# Patient Record
Sex: Male | Born: 1955 | Race: White | Hispanic: No | Marital: Married | Smoking: Never smoker
Health system: Southern US, Community
[De-identification: ages and names within clinical notes are randomized; demographics above are authoritative.]

## PROBLEM LIST (undated history)

## (undated) DIAGNOSIS — K760 Fatty (change of) liver, not elsewhere classified: Secondary | ICD-10-CM

## (undated) DIAGNOSIS — J45909 Unspecified asthma, uncomplicated: Secondary | ICD-10-CM

## (undated) DIAGNOSIS — K5790 Diverticulosis of intestine, part unspecified, without perforation or abscess without bleeding: Secondary | ICD-10-CM

## (undated) DIAGNOSIS — E785 Hyperlipidemia, unspecified: Secondary | ICD-10-CM

## (undated) DIAGNOSIS — N201 Calculus of ureter: Secondary | ICD-10-CM

## (undated) DIAGNOSIS — Z973 Presence of spectacles and contact lenses: Secondary | ICD-10-CM

## (undated) DIAGNOSIS — N200 Calculus of kidney: Secondary | ICD-10-CM

## (undated) DIAGNOSIS — R7303 Prediabetes: Secondary | ICD-10-CM

## (undated) DIAGNOSIS — D3501 Benign neoplasm of right adrenal gland: Secondary | ICD-10-CM

## (undated) DIAGNOSIS — H04123 Dry eye syndrome of bilateral lacrimal glands: Secondary | ICD-10-CM

## (undated) DIAGNOSIS — U071 COVID-19: Secondary | ICD-10-CM

## (undated) HISTORY — PX: CARDIOVASCULAR STRESS TEST: SHX262

## (undated) HISTORY — PX: TONSILLECTOMY: SUR1361

## (undated) HISTORY — DX: Fatty (change of) liver, not elsewhere classified: K76.0

## (undated) HISTORY — DX: Unspecified asthma, uncomplicated: J45.909

## (undated) HISTORY — DX: Hyperlipidemia, unspecified: E78.5

## (undated) HISTORY — PX: OTHER SURGICAL HISTORY: SHX169

## (undated) HISTORY — PX: TRANSTHORACIC ECHOCARDIOGRAM: SHX275

## (undated) HISTORY — DX: COVID-19: U07.1

---

## 1995-03-17 HISTORY — PX: INGUINAL HERNIA REPAIR: SUR1180

## 2001-11-16 ENCOUNTER — Encounter: Admission: RE | Admit: 2001-11-16 | Discharge: 2001-11-16 | Payer: Self-pay | Admitting: General Surgery

## 2001-11-16 ENCOUNTER — Encounter: Payer: Self-pay | Admitting: General Surgery

## 2001-11-18 ENCOUNTER — Ambulatory Visit (HOSPITAL_BASED_OUTPATIENT_CLINIC_OR_DEPARTMENT_OTHER): Admission: RE | Admit: 2001-11-18 | Discharge: 2001-11-18 | Payer: Self-pay | Admitting: General Surgery

## 2001-11-18 ENCOUNTER — Encounter (INDEPENDENT_AMBULATORY_CARE_PROVIDER_SITE_OTHER): Payer: Self-pay | Admitting: Specialist

## 2003-06-06 ENCOUNTER — Emergency Department (HOSPITAL_COMMUNITY): Admission: EM | Admit: 2003-06-06 | Discharge: 2003-06-06 | Payer: Self-pay | Admitting: Emergency Medicine

## 2003-10-30 ENCOUNTER — Encounter: Admission: RE | Admit: 2003-10-30 | Discharge: 2003-10-30 | Payer: Self-pay | Admitting: Family Medicine

## 2003-11-14 ENCOUNTER — Encounter: Admission: RE | Admit: 2003-11-14 | Discharge: 2003-11-14 | Payer: Self-pay | Admitting: Family Medicine

## 2006-11-05 ENCOUNTER — Ambulatory Visit (HOSPITAL_COMMUNITY): Admission: RE | Admit: 2006-11-05 | Discharge: 2006-11-05 | Payer: Self-pay | Admitting: Gastroenterology

## 2006-11-05 ENCOUNTER — Encounter (INDEPENDENT_AMBULATORY_CARE_PROVIDER_SITE_OTHER): Payer: Self-pay | Admitting: Diagnostic Radiology

## 2007-03-17 HISTORY — PX: COLONOSCOPY: SHX174

## 2010-08-01 NOTE — Op Note (Addendum)
   NAMESKYELER, SMOLA                        ACCOUNT NO.:  [id number]   MEDICAL RECORD NO.:  [id number]                   PATIENT TYPE:  AMB   LOCATION:  DSC                                  FACILITY:  [facility]   PHYSICIAN:  Sharlet Salina T. Hoxworth, M.D.          DATE OF BIRTH:  [birth year]   DATE OF PROCEDURE:  DATE OF DISCHARGE:                                 OPERATIVE REPORT   PREOPERATIVE DIAGNOSIS:  Subcutaneous mass, posterior scalp.   POSTOPERATIVE DIAGNOSIS:  Subcutaneous mass, posterior scalp.   PROCEDURE:  Excision subcutaneous mass, posterior scalp.   SURGEON:  Dr. Johna Sheriff.   ANESTHESIA:  Local IV sedation.   BRIEF HISTORY AND PHYSICAL:  Mr. Stiff is a 55 year old white male who has  had a lump palpable in his right occipital scalp for several years.  Recently, it has become enlarged and become significantly uncomfortable with  any pressure in the area.  Examination reveals a 4 cm soft, fleshy mass in  the right occiput subcutaneous tissue consistent with a lipoma.  Due to  enlargement and pain, we have elected to proceed with excision.  The nature  of the procedure, its indications, and risks of bleeding, infection, and  injury to the greater occipital nerve were discussed and understood.  He is  now brought to the operating room for this procedure.   DESCRIPTION OF PROCEDURE:  The patient was brought to the operating room,  placed in supine position on the operating table, and IV sedation was  administered.  He was carefully positioned in the left lateral decubitus  position, and the posterior scalp sterilely prepped and draped.  Local  anesthesia was used to infiltrate the scalp overlying the lesion in the  surrounding soft tissue.  A transverse incision was made directly over the  mass and dissection was carried down through the subcutaneous tissue onto a  well-encapsulated, typical-appearing lipoma.  Although well encapsulated, it  was fairly adherent to the  surrounding subcutaneous tissue which was sharply  dissected free.  It was also quite adherent to the level of the fascia and  was sharply dissected off of this and completely excised.  I was looking for  the greater occipital nerve during the dissection, but it was not  encountered.  Hemostasis was obtained with cautery.  The wound was then  closed with running mattress sutures of 4-0 nylon.  The sponge and needle  counts were correct.  A ____ QA MARKER: 142 ____ dress  the patient was taken to recovery in satisfactory condition.                                               Lorne Skeens. Hoxworth, M.D.    Tory Emerald  D:  11/18/2001  T:  11/20/2001  Job:  [id number]

## 2010-12-26 LAB — CBC
HCT: 45.8
MCHC: 34.9
MCV: 92.7
RBC: 4.94
WBC: 5.2

## 2011-12-29 ENCOUNTER — Other Ambulatory Visit: Payer: Self-pay | Admitting: Gastroenterology

## 2011-12-29 DIAGNOSIS — R7989 Other specified abnormal findings of blood chemistry: Secondary | ICD-10-CM

## 2012-01-04 ENCOUNTER — Ambulatory Visit
Admission: RE | Admit: 2012-01-04 | Discharge: 2012-01-04 | Disposition: A | Discharge status: Home / Self Care | Payer: Managed Care, Other (non HMO) | Source: Ambulatory Visit | Attending: Gastroenterology | Admitting: Gastroenterology

## 2012-01-04 DIAGNOSIS — R7989 Other specified abnormal findings of blood chemistry: Secondary | ICD-10-CM

## 2015-02-28 ENCOUNTER — Encounter: Payer: Self-pay | Admitting: Cardiology

## 2015-04-15 ENCOUNTER — Encounter: Payer: Self-pay | Admitting: Cardiology

## 2015-04-15 ENCOUNTER — Ambulatory Visit (INDEPENDENT_AMBULATORY_CARE_PROVIDER_SITE_OTHER): Payer: BLUE CROSS/BLUE SHIELD | Admitting: Cardiology

## 2015-04-15 VITALS — BP 116/74 | HR 76 | Ht 69.0 in | Wt 230.8 lb

## 2015-04-15 DIAGNOSIS — E669 Obesity, unspecified: Secondary | ICD-10-CM

## 2015-04-15 DIAGNOSIS — R0600 Dyspnea, unspecified: Secondary | ICD-10-CM

## 2015-04-15 DIAGNOSIS — E119 Type 2 diabetes mellitus without complications: Secondary | ICD-10-CM | POA: Diagnosis not present

## 2015-04-15 DIAGNOSIS — E785 Hyperlipidemia, unspecified: Secondary | ICD-10-CM

## 2015-04-15 DIAGNOSIS — R0609 Other forms of dyspnea: Secondary | ICD-10-CM | POA: Diagnosis not present

## 2015-04-15 NOTE — Progress Notes (Signed)
Cardiology Office Note    Date:  04/15/2015   ID:  Kyle Baird, DOB 17-Sep-1955, MRN [id number]  PCP:  Harle Battiest, MD  Cardiologist:   Candee Furbish, MD     History of Present Illness:  Kyle Baird is a 60 y.o. male previously seen by myself on 08/15/2009 with prior chest pain episode after riding ML bike with his son. He felt her heart skip. He felt flushed. Had sharp stabbing left-sided fleeting pain. His father died at age 32 from heart disease and diabetes and had a myocardial infarction age 65, his mother also had bypass surgery. A nuclear stress test was performed on 08/22/2009 which was low risk, no ischemia, normal ejection fraction, exercise for 6 minutes.  Currently he has been describing exertional dyspnea, he is concerned that this is his heart. Dr. Jacelyn Grip. Noted when going up stairs.   A1c 6.6. Lost 17 pounds. Walking 2 miles a day.   Since weight loss, not noting this symptom.   His prior LDL was 150 him HDL 41 previous EKG reviewed showed sinus rhythm with no other significant abnormalities.  Was on Lipitor but stopped, felt like old man getting out of bed.   Art gallery manager at [employer].     Past Medical History  Diagnosis Date  . Hyperlipidemia   . Fatty liver   . Diabetes mellitus without complication [facility])     Past Surgical History  Procedure Laterality Date  . Mass excision      subcutaneous mass posterior scalp/ Dr Excell Seltzer 2012  . 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 Hernia repair  1997    X2  . Tonsillectomy  1995    Outpatient Prescriptions Prior to Visit  Medication Sig Dispense Refill  . Multiple Vitamin (MULTIVITAMIN) tablet Take 1 tablet by mouth daily.    . Omega-3 Fatty Acids (FISH OIL PO) Take 2 tablets by mouth daily.     . vitamin E 400 UNIT capsule Take 400 Units by mouth daily.    Marland Kitchen loteprednol (LOTEMAX) 0.2 % SUSP Place 1 drop into both eyes 4 (four) times daily.      No facility-administered medications prior to visit.     Allergies:   Penicillins and Trimox   Social History   Social History  . Marital Status: Married    Spouse Name: N/A  . Number of Children: N/A  . Years of Education: N/A   Social History Main Topics  . Smoking status: Never Smoker   . Smokeless tobacco: None  . Alcohol Use: None  . Drug Use: None  . Sexual Activity: No   Other Topics Concern  . None   Social History Narrative     Family History:  The patient's family history includes Cirrhosis in his mother; Diabetes Mellitus II in his father and mother; Healthy in his daughter, sister, and son; Heart attack (age of onset: 37) in his father; Liver disease in his father and mother; Pulmonary embolism in his brother.   ROS:   Please see the history of present illness.    ROS denies any syncope, bleeding, orthopnea, PND. Has shortness of breath with activity. All other systems reviewed and are negative.   PHYSICAL EXAM:   VS:  BP 116/74 mmHg  Pulse 76  Ht 5\' 9"  (1.753 m)  Wt 230 lb 12.8 oz (104.69 kg)  BMI 34.07 kg/m2   GEN: Well nourished, well developed, in no acute distress HEENT: normal Neck: no  JVD, carotid bruits, or masses Cardiac: RRR; no murmurs, rubs, or gallops,no edema  Respiratory:  clear to auscultation bilaterally, normal work of breathing GI: soft, nontender, nondistended, + BS MS: no deformity or atrophy Skin: warm and dry, no rash Neuro:  Alert and Oriented x 3, Strength and sensation are intact Psych: euthymic mood, full affect  Wt Readings from Last 3 Encounters:  04/15/15 230 lb 12.8 oz (104.69 kg)      Studies/Labs Reviewed:   EKG:  EKG is ordered today.  The ekg ordered today demonstrates 04/15/15-sinus rhythm with no other abnormalities exertional dyspnea   Recent Labs: No results found for requested labs within last 365 days.   Lipid Panel No results found for: CHOL, TRIG, HDL, CHOLHDL, VLDL, LDLCALC, LDLDIRECT  Additional studies/ records that were reviewed today include:    Nuclear stress test 2011-low risk, no ischemia, 6 minutes, normal EF    old office notes reviewed, lab work reviewed     ASSESSMENT:    No diagnosis found.   PLAN:  In order of problems listed above:  1. Exertional dyspnea-with strong family history, we will proceed with nuclear stress test to ensure that there is no evidence of ischemia. Symptoms could be multifactorial including deconditioning, weight for instance.  he states that he is feeling better since he has lost 17 pounds. This is excellent. However, we discussed that diabetes can be a coronary artery disease equivalent and with strong family history we will proceed with stress testing.  2. Hyperlipidemia-with diagnoses of diabetes, I would strongly recommend statin therapy as this is per guidelines. He had trouble with atorvastatin in the past making him feel old he states when getting out of bed. Currently on red yeast rice. Continue to discuss this with Dr. Harrington Challenger. He had mentioned that Dr. Harrington Challenger told him that Crestor may be an option. LDL has been in the 160 range.  3.  obesity-great job with 17 pound weight loss. Continue.  4.  diabetes-hemoglobin A1c 6.6. Hopefully with weight loss, his hemoglobin A1c will drop.     Medication Adjustments/Labs and Tests Ordered: Current medicines are reviewed at length with the patient today.  Concerns regarding medicines are outlined above.  Medication changes, Labs and Tests ordered today are listed in the Patient Instructions below. There are no Patient Instructions on file for this visit.     Bobby Rumpf, MD  04/15/2015 8:46 AM    [address], [place], [place]  [address] Phone: [phone]; Fax: [phone]

## 2015-04-15 NOTE — Patient Instructions (Signed)
Medication Instructions:  NO CHANGES  Labwork: NONE  Testing/Procedures: Your physician has requested that you have en exercise stress myoview. For further information please visit [email or web address]. Please follow instruction sheet, as given.   Follow-Up: PENDING  TEST  RESULTS  Any Other Special Instructions Will Be Listed Below (If Applicable). POSSIBLE  STATIN DISCUSS WITH  DR Harrington Challenger    If you need a refill on your cardiac medications before your next appointment, please call your pharmacy.

## 2015-04-17 ENCOUNTER — Telehealth (HOSPITAL_COMMUNITY): Payer: Self-pay | Admitting: *Deleted

## 2015-04-17 NOTE — Telephone Encounter (Signed)
Left message on voicemail in reference to upcoming appointment scheduled for 04/22/15. Phone number given for a call back so details instructions can be given. Hubbard Robinson, RN

## 2015-04-18 ENCOUNTER — Telehealth (HOSPITAL_COMMUNITY): Payer: Self-pay | Admitting: *Deleted

## 2015-04-18 NOTE — Telephone Encounter (Signed)
Left message on voicemail in reference to upcoming appointment scheduled for 04/22/15. Phone number given for a call back so details instructions can be given. Kwana Ringel, Ranae Palms

## 2015-04-19 ENCOUNTER — Telehealth (HOSPITAL_COMMUNITY): Payer: Self-pay | Admitting: *Deleted

## 2015-04-19 NOTE — Telephone Encounter (Signed)
Patient given detailed instructions per Myocardial Perfusion Study Information Sheet for the test on 04/22/15 at 7:30. Patient notified to arrive 15 minutes early and that it is imperative to arrive on time for appointment to keep from having the test rescheduled.  If you need to cancel or reschedule your appointment, please call the office within 24 hours of your appointment. Failure to do so may result in a cancellation of your appointment, and a $50 no show fee. Patient verbalized understanding.Kyle Baird

## 2015-04-22 ENCOUNTER — Ambulatory Visit (HOSPITAL_COMMUNITY): Payer: BLUE CROSS/BLUE SHIELD | Attending: Cardiovascular Disease

## 2015-04-22 DIAGNOSIS — Z8249 Family history of ischemic heart disease and other diseases of the circulatory system: Secondary | ICD-10-CM | POA: Diagnosis not present

## 2015-04-22 DIAGNOSIS — R0609 Other forms of dyspnea: Secondary | ICD-10-CM | POA: Diagnosis not present

## 2015-04-22 DIAGNOSIS — R079 Chest pain, unspecified: Secondary | ICD-10-CM | POA: Diagnosis not present

## 2015-04-22 DIAGNOSIS — R9439 Abnormal result of other cardiovascular function study: Secondary | ICD-10-CM | POA: Insufficient documentation

## 2015-04-22 DIAGNOSIS — R002 Palpitations: Secondary | ICD-10-CM | POA: Diagnosis not present

## 2015-04-22 DIAGNOSIS — E119 Type 2 diabetes mellitus without complications: Secondary | ICD-10-CM | POA: Diagnosis not present

## 2015-04-22 DIAGNOSIS — R0600 Dyspnea, unspecified: Secondary | ICD-10-CM

## 2015-04-22 LAB — MYOCARDIAL PERFUSION IMAGING
CHL CUP MPHR: 162 {beats}/min
CHL CUP NUCLEAR SDS: 0
CHL CUP RESTING HR STRESS: 75 {beats}/min
CSEPEDS: 30 s
CSEPEW: 9.1 METS
CSEPHR: 93 %
CSEPPHR: 151 {beats}/min
Exercise duration (min): 7 min
LHR: 0.42
LVDIAVOL: 86 mL
LVSYSVOL: 46 mL
SRS: 6
SSS: 6
TID: 1.15

## 2015-04-22 MED ORDER — TECHNETIUM TC 99M SESTAMIBI GENERIC - CARDIOLITE
10.7000 | Freq: Once | INTRAVENOUS | Status: AC | PRN
  Administered 2015-04-22: 11 via INTRAVENOUS

## 2015-04-22 MED ORDER — TECHNETIUM TC 99M SESTAMIBI GENERIC - CARDIOLITE
31.1000 | Freq: Once | INTRAVENOUS | Status: AC | PRN
  Administered 2015-04-22: 31.1 via INTRAVENOUS

## 2015-05-01 ENCOUNTER — Telehealth: Payer: Self-pay | Admitting: Cardiology

## 2015-05-01 DIAGNOSIS — R0600 Dyspnea, unspecified: Secondary | ICD-10-CM

## 2015-05-01 NOTE — Telephone Encounter (Signed)
Reviewed results of testing with pt who states understanding.  He is aware he needs further testing in the form of a 2 D Echo and that he will be called to schedule that appt.

## 2015-05-01 NOTE — Telephone Encounter (Signed)
New message      Calling for stress test results from last week

## 2015-06-03 ENCOUNTER — Ambulatory Visit (HOSPITAL_COMMUNITY): Payer: BLUE CROSS/BLUE SHIELD | Attending: Cardiology

## 2015-06-03 ENCOUNTER — Other Ambulatory Visit: Payer: Self-pay

## 2015-06-03 DIAGNOSIS — R0600 Dyspnea, unspecified: Secondary | ICD-10-CM | POA: Insufficient documentation

## 2015-06-03 DIAGNOSIS — I253 Aneurysm of heart: Secondary | ICD-10-CM | POA: Diagnosis not present

## 2015-06-03 DIAGNOSIS — E119 Type 2 diabetes mellitus without complications: Secondary | ICD-10-CM | POA: Diagnosis not present

## 2015-06-03 DIAGNOSIS — E785 Hyperlipidemia, unspecified: Secondary | ICD-10-CM | POA: Diagnosis not present

## 2015-06-03 DIAGNOSIS — I5189 Other ill-defined heart diseases: Secondary | ICD-10-CM | POA: Diagnosis not present

## 2015-06-03 DIAGNOSIS — Z8249 Family history of ischemic heart disease and other diseases of the circulatory system: Secondary | ICD-10-CM | POA: Insufficient documentation

## 2015-06-03 DIAGNOSIS — I071 Rheumatic tricuspid insufficiency: Secondary | ICD-10-CM | POA: Insufficient documentation

## 2015-06-06 ENCOUNTER — Telehealth: Payer: Self-pay | Admitting: Cardiology

## 2015-06-06 NOTE — Telephone Encounter (Signed)
Left message for pt to call back to review results of echo

## 2015-06-13 ENCOUNTER — Encounter: Payer: Self-pay | Admitting: *Deleted

## 2015-06-13 NOTE — Telephone Encounter (Signed)
Letter of results mailed to pt's home address. 

## 2015-09-02 DIAGNOSIS — R740 Nonspecific elevation of levels of transaminase and lactic acid dehydrogenase [LDH]: Secondary | ICD-10-CM | POA: Diagnosis not present

## 2015-09-02 DIAGNOSIS — R7309 Other abnormal glucose: Secondary | ICD-10-CM | POA: Diagnosis not present

## 2015-09-02 DIAGNOSIS — E785 Hyperlipidemia, unspecified: Secondary | ICD-10-CM | POA: Diagnosis not present

## 2015-09-05 DIAGNOSIS — R749 Abnormal serum enzyme level, unspecified: Secondary | ICD-10-CM | POA: Diagnosis not present

## 2015-09-05 DIAGNOSIS — N401 Enlarged prostate with lower urinary tract symptoms: Secondary | ICD-10-CM | POA: Diagnosis not present

## 2015-09-24 DIAGNOSIS — H04123 Dry eye syndrome of bilateral lacrimal glands: Secondary | ICD-10-CM | POA: Diagnosis not present

## 2015-09-24 DIAGNOSIS — H2513 Age-related nuclear cataract, bilateral: Secondary | ICD-10-CM | POA: Diagnosis not present

## 2015-09-24 DIAGNOSIS — H1852 Epithelial (juvenile) corneal dystrophy: Secondary | ICD-10-CM | POA: Diagnosis not present

## 2015-09-24 DIAGNOSIS — H16223 Keratoconjunctivitis sicca, not specified as Sjogren's, bilateral: Secondary | ICD-10-CM | POA: Diagnosis not present

## 2015-12-25 DIAGNOSIS — J069 Acute upper respiratory infection, unspecified: Secondary | ICD-10-CM | POA: Diagnosis not present

## 2016-01-09 DIAGNOSIS — Z23 Encounter for immunization: Secondary | ICD-10-CM | POA: Diagnosis not present

## 2016-01-13 DIAGNOSIS — H16223 Keratoconjunctivitis sicca, not specified as Sjogren's, bilateral: Secondary | ICD-10-CM | POA: Diagnosis not present

## 2016-01-13 DIAGNOSIS — H04123 Dry eye syndrome of bilateral lacrimal glands: Secondary | ICD-10-CM | POA: Diagnosis not present

## 2016-01-13 DIAGNOSIS — H01003 Unspecified blepharitis right eye, unspecified eyelid: Secondary | ICD-10-CM | POA: Diagnosis not present

## 2016-04-13 DIAGNOSIS — Z0000 Encounter for general adult medical examination without abnormal findings: Secondary | ICD-10-CM | POA: Diagnosis not present

## 2016-04-13 DIAGNOSIS — R7309 Other abnormal glucose: Secondary | ICD-10-CM | POA: Diagnosis not present

## 2016-04-16 ENCOUNTER — Emergency Department (HOSPITAL_COMMUNITY): Payer: BLUE CROSS/BLUE SHIELD

## 2016-04-16 ENCOUNTER — Encounter (HOSPITAL_COMMUNITY): Payer: Self-pay

## 2016-04-16 ENCOUNTER — Emergency Department (HOSPITAL_COMMUNITY)
Admission: EM | Admit: 2016-04-16 | Discharge: 2016-04-16 | Disposition: A | Discharge status: Home / Self Care | Payer: BLUE CROSS/BLUE SHIELD | Attending: Emergency Medicine | Admitting: Emergency Medicine

## 2016-04-16 DIAGNOSIS — Z794 Long term (current) use of insulin: Secondary | ICD-10-CM | POA: Insufficient documentation

## 2016-04-16 DIAGNOSIS — Z79899 Other long term (current) drug therapy: Secondary | ICD-10-CM | POA: Insufficient documentation

## 2016-04-16 DIAGNOSIS — Z7982 Long term (current) use of aspirin: Secondary | ICD-10-CM | POA: Diagnosis not present

## 2016-04-16 DIAGNOSIS — E119 Type 2 diabetes mellitus without complications: Secondary | ICD-10-CM | POA: Diagnosis not present

## 2016-04-16 DIAGNOSIS — R109 Unspecified abdominal pain: Secondary | ICD-10-CM | POA: Diagnosis present

## 2016-04-16 DIAGNOSIS — N202 Calculus of kidney with calculus of ureter: Secondary | ICD-10-CM | POA: Diagnosis not present

## 2016-04-16 DIAGNOSIS — N132 Hydronephrosis with renal and ureteral calculous obstruction: Secondary | ICD-10-CM | POA: Insufficient documentation

## 2016-04-16 DIAGNOSIS — N201 Calculus of ureter: Secondary | ICD-10-CM

## 2016-04-16 LAB — CBC
HEMATOCRIT: 44.4 % (ref 39.0–52.0)
Hemoglobin: 15.8 g/dL (ref 13.0–17.0)
MCH: 33 pg (ref 26.0–34.0)
MCHC: 35.6 g/dL (ref 30.0–36.0)
MCV: 92.7 fL (ref 78.0–100.0)
PLATELETS: 199 10*3/uL (ref 150–400)
RBC: 4.79 MIL/uL (ref 4.22–5.81)
RDW: 12.9 % (ref 11.5–15.5)
WBC: 11.6 10*3/uL — AB (ref 4.0–10.5)

## 2016-04-16 LAB — URINALYSIS, ROUTINE W REFLEX MICROSCOPIC
BILIRUBIN URINE: NEGATIVE
Glucose, UA: NEGATIVE mg/dL
KETONES UR: NEGATIVE mg/dL
LEUKOCYTES UA: NEGATIVE
NITRITE: NEGATIVE
PH: 5 (ref 5.0–8.0)
Protein, ur: 30 mg/dL — AB
Specific Gravity, Urine: 1.019 (ref 1.005–1.030)

## 2016-04-16 LAB — COMPREHENSIVE METABOLIC PANEL
ALT: 45 U/L (ref 17–63)
AST: 37 U/L (ref 15–41)
Albumin: 4.6 g/dL (ref 3.5–5.0)
Alkaline Phosphatase: 66 U/L (ref 38–126)
Anion gap: 9 (ref 5–15)
BILIRUBIN TOTAL: 0.7 mg/dL (ref 0.3–1.2)
BUN: 13 mg/dL (ref 6–20)
CO2: 24 mmol/L (ref 22–32)
Calcium: 9.2 mg/dL (ref 8.9–10.3)
Chloride: 105 mmol/L (ref 101–111)
Creatinine, Ser: 1.32 mg/dL — ABNORMAL HIGH (ref 0.61–1.24)
GFR, EST NON AFRICAN AMERICAN: 57 mL/min — AB (ref >60)
Glucose, Bld: 178 mg/dL — ABNORMAL HIGH (ref 65–99)
POTASSIUM: 4.2 mmol/L (ref 3.5–5.1)
Sodium: 138 mmol/L (ref 135–145)
TOTAL PROTEIN: 7.5 g/dL (ref 6.5–8.1)

## 2016-04-16 LAB — LIPASE, BLOOD: Lipase: 29 U/L (ref 11–51)

## 2016-04-16 MED ORDER — FENTANYL CITRATE (PF) 100 MCG/2ML IJ SOLN
50.0000 ug | Freq: Once | INTRAMUSCULAR | Status: AC
  Administered 2016-04-16: 50 ug via INTRAVENOUS
  Filled 2016-04-16: qty 2

## 2016-04-16 MED ORDER — OXYCODONE-ACETAMINOPHEN 5-325 MG PO TABS: 1.0000 | ORAL_TABLET | Freq: Four times a day (QID) | ORAL | 0 refills | Status: DC | PRN

## 2016-04-16 MED ORDER — KETOROLAC TROMETHAMINE 30 MG/ML IJ SOLN
15.0000 mg | Freq: Once | INTRAMUSCULAR | Status: AC
  Administered 2016-04-16: 15 mg via INTRAVENOUS
  Filled 2016-04-16: qty 1

## 2016-04-16 MED ORDER — ONDANSETRON HCL 4 MG/2ML IJ SOLN
4.0000 mg | Freq: Once | INTRAMUSCULAR | Status: AC
  Administered 2016-04-16: 4 mg via INTRAVENOUS
  Filled 2016-04-16: qty 2

## 2016-04-16 NOTE — ED Provider Notes (Signed)
[facility] DEPT Provider Note   CSN: [id number] Arrival date & time: 04/16/16  [place]     History   Chief Complaint Chief Complaint  Patient presents with  . Abdominal Pain  . Groin Pain    HPI Kyle Baird is a 61 y.o. male.  HPI Patient presents with left testicle pain. Began yesterday. Starts in left testicle works with up to his abdomen and flank. No dysuria.. States he just feels weak and have a bowel movement. His had some nausea but no vomiting diarrhea. States he may have had a fever at home. He has not had pain like this before. Pain is dull. States he cannot find a comfortable position.   Past Medical History:  Diagnosis Date  . Diabetes mellitus without complication ([place])   . Fatty liver   . Hyperlipidemia     Patient Active Problem List   Diagnosis Date Noted  . Exertional dyspnea 04/15/2015  . Obesity 04/15/2015  . Type 2 diabetes mellitus without complication, without long-term current use of insulin ([place]) 04/15/2015  . Hyperlipidemia 04/15/2015    Past Surgical History:  Procedure Laterality Date  . HERNIA REPAIR  1997   X2  . MASS EXCISION     subcutaneous mass posterior scalp/ Dr Excell Seltzer 2012  . REMOVAL OF LYMPHOMA  2003   OF BACK  . TONSILLECTOMY  1995       Home Medications    Prior to Admission medications   Medication Sig Start Date End Date Taking? Authorizing Provider  aspirin 81 MG tablet Take 81 mg by mouth daily.   Yes Historical Provider, MD  Cholecalciferol (VITAMIN D PO) Take 1 tablet by mouth daily.   Yes Historical Provider, MD  ibuprofen (ADVIL,MOTRIN) 200 MG tablet Take 600 mg by mouth every 6 (six) hours as needed.   Yes Historical Provider, MD  Multiple Vitamin (MULTIVITAMIN) tablet Take 1 tablet by mouth daily.   Yes Historical Provider, MD  Omega-3 Fatty Acids (FISH OIL PO) Take 2 tablets by mouth daily.    Yes Historical Provider, MD  Red Yeast Rice Extract (RED YEAST RICE PO) Take 1 tablet by mouth 2 (two) times  daily.   Yes Historical Provider, MD  tamsulosin (FLOMAX) 0.4 MG CAPS capsule Take 0.4 mg by mouth daily. 03/17/16  Yes Historical Provider, MD  vitamin E 400 UNIT capsule Take 400 Units by mouth daily.   Yes Historical Provider, MD  XIIDRA 5 % SOLN PLACE 1 DROP INTO BOTH EYES TWICE A DAY 03/22/15  Yes Historical Provider, MD  oxyCODONE-acetaminophen (PERCOCET/ROXICET) 5-325 MG tablet Take 1-2 tablets by mouth every 6 (six) hours as needed for severe pain. 04/16/16   Davonna Belling, MD    Family History Family History  Problem Relation Age of Onset  . Diabetes Mellitus II Mother   . Cirrhosis Mother   . Liver disease Mother   . Heart attack Father 72    CABG   . Diabetes Mellitus II Father   . Liver disease Father   . Healthy Sister   . Pulmonary embolism Brother   . Healthy Daughter   . Healthy Son     Social History Social History  Substance Use Topics  . Smoking status: Never Smoker  . Smokeless tobacco: Not on file  . Alcohol use Not on file     Allergies   Penicillins and Trimox [amoxicillin]   Review of Systems Review of Systems  Constitutional: Negative for activity change, appetite change and fever.  HENT: Negative for congestion.   Eyes: Negative for pain.  Respiratory: Negative for chest tightness and shortness of breath.   Cardiovascular: Negative for chest pain and leg swelling.  Gastrointestinal: Positive for abdominal pain and nausea. Negative for diarrhea and vomiting.  Genitourinary: Positive for flank pain and testicular pain. Negative for discharge, penile pain and scrotal swelling.  Musculoskeletal: Negative for back pain and neck stiffness.  Skin: Negative for rash.  Neurological: Negative for weakness, numbness and headaches.  Psychiatric/Behavioral: Negative for behavioral problems.     Physical Exam Updated Vital Signs BP (!) 154/133 (BP Location: Right Arm)   Pulse 86   Temp 98.7 F (37.1 C) (Oral)   Resp 18   Ht 5\' 9"  (1.753 m)   Wt 234  lb (106.1 kg)   SpO2 96%   BMI 34.56 kg/m   Physical Exam  Constitutional: He appears well-developed.  HENT:  Head: Atraumatic.  Neck: Neck supple.  Cardiovascular: Normal rate.   Pulmonary/Chest: Effort normal.  Abdominal: There is tenderness.  Some CVA tenderness on left side and left lower quadrant tenderness. No hernia palpated.  Genitourinary: Penis normal. No penile tenderness.  Genitourinary Comments: No testicular tenderness and normal testicular lie.  Musculoskeletal: Normal range of motion.  Neurological: He is alert.  Skin: Skin is warm. Capillary refill takes less than 2 seconds.     ED Treatments / Results  Labs (all labs ordered are listed, but only abnormal results are displayed) Labs Reviewed  COMPREHENSIVE METABOLIC PANEL - Abnormal; Notable for the following:       Result Value   Glucose, Bld 178 (*)    Creatinine, Ser 1.32 (*)    GFR calc non Af Amer 57 (*)    All other components within normal limits  CBC - Abnormal; Notable for the following:    WBC 11.6 (*)    All other components within normal limits  URINALYSIS, ROUTINE W REFLEX MICROSCOPIC - Abnormal; Notable for the following:    Hgb urine dipstick LARGE (*)    Protein, ur 30 (*)    Bacteria, UA RARE (*)    Squamous Epithelial / LPF 0-5 (*)    All other components within normal limits  LIPASE, BLOOD    EKG  EKG Interpretation None       Radiology Ct Renal Stone Study  Result Date: 04/16/2016 CLINICAL DATA:  Left testicular pain, extending up into the abdomen. Onset yesterday. EXAM: CT ABDOMEN AND PELVIS WITHOUT CONTRAST TECHNIQUE: Multidetector CT imaging of the abdomen and pelvis was performed following the standard protocol without IV contrast. COMPARISON:  None. FINDINGS: Lower chest: No acute abnormality. Hepatobiliary: No focal liver abnormality is seen. No gallstones, gallbladder wall thickening, or biliary dilatation. Pancreas: Unremarkable. No pancreatic ductal dilatation or  surrounding inflammatory changes. Spleen: Normal in size without focal abnormality. Adrenals/Urinary Tract: Both adrenals are unremarkable. There is marked hydronephrosis and ureteral dilatation on the left, due to a 4 x 5 mm obstructing calculus at the level of the iliac vasculature. An additional 4 mm calculus is present in the lower pole left renal collecting system. Right kidney, right collecting system and right ureter are normal. Urinary bladder is unremarkable. Stomach/Bowel: Small hiatal hernia. Stomach and small bowel are otherwise unremarkable. Appendix is normal. Colon is unremarkable. Vascular/Lymphatic: The abdominal aorta is normal in caliber with mild atherosclerotic calcification. No pathologic adenopathy in the abdomen or pelvis. Reproductive: Unremarkable Other: No ascites.  Small fat containing umbilical hernia. Musculoskeletal: No significant skeletal lesion. IMPRESSION:  1. Obstructing 4 x 5 mm left ureteral calculus at the level of the iliac vasculature with marked hydronephrosis. 2. Lower pole left nephrolithiasis. 3. Small hiatal hernia. 4. Small fat containing umbilical hernia. Electronically Signed   By: Andreas Newport M.D.   On: 04/16/2016 21:49    Procedures Procedures (including critical care time)  Medications Ordered in ED Medications  ondansetron [facility]) injection 4 mg (4 mg Intravenous Given 04/16/16 2120)  fentaNYL (SUBLIMAZE) injection 50 mcg (50 mcg Intravenous Given 04/16/16 2122)  ketorolac (TORADOL) 30 MG/ML injection 15 mg (15 mg Intravenous Given 04/16/16 2231)     Initial Impression / Assessment and Plan / ED Course  I have reviewed the triage vital signs and the nursing notes.  Pertinent labs & imaging results that were available during my care of the patient were reviewed by me and considered in my medical decision making (see chart for details).     Patient with testicle pain and flank pain. Found to have ureteral stone. Labs otherwise reassuring. Feels  better after treatment. No infection. Artery on Flomax. Has a urologist but does not remember his name. Will discharge home to follow-up with urology.  Final Clinical Impressions(s) / ED Diagnoses   Final diagnoses:  Left ureteral stone    New Prescriptions New Prescriptions   OXYCODONE-ACETAMINOPHEN (PERCOCET/ROXICET) 5-325 MG TABLET    Take 1-2 tablets by mouth every 6 (six) hours as needed for severe pain.     Davonna Belling, MD 04/16/16 [phone]

## 2016-04-16 NOTE — ED Notes (Signed)
Patient transported to CT 

## 2016-04-16 NOTE — ED Triage Notes (Signed)
Pt presents with c/o pain that begins in his left testicle and radiates to his abdomen. Pt reports the pain started yesterday. Pt denies any testicle swelling that he can report. Pt reports some nausea but no vomiting or diarrhea.

## 2016-04-16 NOTE — ED Notes (Signed)
Pt given and educated on urine strainer, discharge information and followup. Pt verbalizes understanding.

## 2016-04-23 DIAGNOSIS — N401 Enlarged prostate with lower urinary tract symptoms: Secondary | ICD-10-CM | POA: Diagnosis not present

## 2016-04-23 DIAGNOSIS — Z0000 Encounter for general adult medical examination without abnormal findings: Secondary | ICD-10-CM | POA: Diagnosis not present

## 2016-04-23 DIAGNOSIS — L989 Disorder of the skin and subcutaneous tissue, unspecified: Secondary | ICD-10-CM | POA: Diagnosis not present

## 2016-04-23 DIAGNOSIS — J309 Allergic rhinitis, unspecified: Secondary | ICD-10-CM | POA: Diagnosis not present

## 2016-06-28 DIAGNOSIS — R197 Diarrhea, unspecified: Secondary | ICD-10-CM | POA: Diagnosis not present

## 2016-08-05 DIAGNOSIS — S81811A Laceration without foreign body, right lower leg, initial encounter: Secondary | ICD-10-CM | POA: Diagnosis not present

## 2016-08-05 DIAGNOSIS — Z23 Encounter for immunization: Secondary | ICD-10-CM | POA: Diagnosis not present

## 2016-08-28 DIAGNOSIS — R1032 Left lower quadrant pain: Secondary | ICD-10-CM | POA: Diagnosis not present

## 2016-08-28 DIAGNOSIS — R1031 Right lower quadrant pain: Secondary | ICD-10-CM | POA: Diagnosis not present

## 2016-09-02 ENCOUNTER — Emergency Department (HOSPITAL_COMMUNITY)
Admission: EM | Admit: 2016-09-02 | Discharge: 2016-09-02 | Disposition: A | Discharge status: Home / Self Care | Payer: BLUE CROSS/BLUE SHIELD | Attending: Emergency Medicine | Admitting: Emergency Medicine

## 2016-09-02 ENCOUNTER — Emergency Department (HOSPITAL_COMMUNITY): Payer: BLUE CROSS/BLUE SHIELD

## 2016-09-02 ENCOUNTER — Encounter (HOSPITAL_COMMUNITY): Payer: Self-pay

## 2016-09-02 DIAGNOSIS — N200 Calculus of kidney: Secondary | ICD-10-CM | POA: Insufficient documentation

## 2016-09-02 DIAGNOSIS — R109 Unspecified abdominal pain: Secondary | ICD-10-CM | POA: Diagnosis not present

## 2016-09-02 DIAGNOSIS — N179 Acute kidney failure, unspecified: Secondary | ICD-10-CM | POA: Diagnosis not present

## 2016-09-02 DIAGNOSIS — Z7982 Long term (current) use of aspirin: Secondary | ICD-10-CM | POA: Diagnosis not present

## 2016-09-02 DIAGNOSIS — Z79899 Other long term (current) drug therapy: Secondary | ICD-10-CM | POA: Diagnosis not present

## 2016-09-02 DIAGNOSIS — E119 Type 2 diabetes mellitus without complications: Secondary | ICD-10-CM | POA: Insufficient documentation

## 2016-09-02 DIAGNOSIS — N132 Hydronephrosis with renal and ureteral calculous obstruction: Secondary | ICD-10-CM | POA: Diagnosis not present

## 2016-09-02 DIAGNOSIS — E279 Disorder of adrenal gland, unspecified: Secondary | ICD-10-CM | POA: Diagnosis not present

## 2016-09-02 DIAGNOSIS — E278 Other specified disorders of adrenal gland: Secondary | ICD-10-CM

## 2016-09-02 LAB — COMPREHENSIVE METABOLIC PANEL
ALBUMIN: 4.2 g/dL (ref 3.5–5.0)
ALK PHOS: 57 U/L (ref 38–126)
ALT: 32 U/L (ref 17–63)
ANION GAP: 9 (ref 5–15)
AST: 21 U/L (ref 15–41)
BILIRUBIN TOTAL: 0.8 mg/dL (ref 0.3–1.2)
BUN: 20 mg/dL (ref 6–20)
CALCIUM: 9.1 mg/dL (ref 8.9–10.3)
CO2: 22 mmol/L (ref 22–32)
CREATININE: 1.79 mg/dL — AB (ref 0.61–1.24)
Chloride: 108 mmol/L (ref 101–111)
GFR calc Af Amer: 46 mL/min — ABNORMAL LOW (ref >60)
GFR calc non Af Amer: 40 mL/min — ABNORMAL LOW (ref >60)
Glucose, Bld: 117 mg/dL — ABNORMAL HIGH (ref 65–99)
Potassium: 4.5 mmol/L (ref 3.5–5.1)
Sodium: 139 mmol/L (ref 135–145)
TOTAL PROTEIN: 7.8 g/dL (ref 6.5–8.1)

## 2016-09-02 LAB — CBC
HCT: 42.3 % (ref 39.0–52.0)
HEMOGLOBIN: 14.5 g/dL (ref 13.0–17.0)
MCH: 32.2 pg (ref 26.0–34.0)
MCHC: 34.3 g/dL (ref 30.0–36.0)
MCV: 93.8 fL (ref 78.0–100.0)
PLATELETS: 187 10*3/uL (ref 150–400)
RBC: 4.51 MIL/uL (ref 4.22–5.81)
RDW: 12.5 % (ref 11.5–15.5)
WBC: 9.6 10*3/uL (ref 4.0–10.5)

## 2016-09-02 LAB — LIPASE, BLOOD: Lipase: 30 U/L (ref 11–51)

## 2016-09-02 LAB — URINALYSIS, ROUTINE W REFLEX MICROSCOPIC
Bacteria, UA: NONE SEEN
Bilirubin Urine: NEGATIVE
GLUCOSE, UA: NEGATIVE mg/dL
HGB URINE DIPSTICK: NEGATIVE
Ketones, ur: NEGATIVE mg/dL
NITRITE: NEGATIVE
PH: 5 (ref 5.0–8.0)
Protein, ur: NEGATIVE mg/dL
SPECIFIC GRAVITY, URINE: 1.028 (ref 1.005–1.030)
Squamous Epithelial / LPF: NONE SEEN

## 2016-09-02 MED ORDER — ONDANSETRON 4 MG PO TBDP: ORAL_TABLET | ORAL | 0 refills | Status: DC

## 2016-09-02 MED ORDER — MORPHINE SULFATE 15 MG PO TABS: 15.0000 mg | ORAL_TABLET | ORAL | 0 refills | Status: DC | PRN

## 2016-09-02 MED ORDER — TAMSULOSIN HCL 0.4 MG PO CAPS: 0.4000 mg | ORAL_CAPSULE | Freq: Every day | ORAL | 0 refills | Status: DC

## 2016-09-02 NOTE — ED Provider Notes (Signed)
[facility] DEPT Provider Note   CSN: [id number] Arrival date & time: 09/02/16  0754     History   Chief Complaint Chief Complaint  Patient presents with  . Flank Pain    HPI Kyle MCCABE is a 61 y.o. male.  61 yo M with a chief complaint of left flank pain. Going on for the past week or so. Saw his family doctor who thought it might be diverticulitis and was started on Cipro Flagyl. The patient then started feeling like it felt more like a prior kidney stone. Has been taking Advil and Percocet with improvement. He is concerned because the symptoms have persisted. Denies fevers denies dysuria. Feels that his urinary output has decreased. Denies hematuria. Pain is worse at night. Comes in waves. Having some nausea denies vomiting.   The history is provided by the patient.  Flank Pain  This is a new problem. The current episode started more than 2 days ago. The problem occurs constantly. The problem has not changed since onset.Pertinent negatives include no chest pain, no abdominal pain, no headaches and no shortness of breath. Nothing aggravates the symptoms. Nothing relieves the symptoms. He has tried nothing for the symptoms. The treatment provided no relief.    Past Medical History:  Diagnosis Date  . Diabetes mellitus without complication ([place])   . Fatty liver   . Hyperlipidemia     Patient Active Problem List   Diagnosis Date Noted  . Exertional dyspnea 04/15/2015  . Obesity 04/15/2015  . Type 2 diabetes mellitus without complication, without long-term current use of insulin ([place]) 04/15/2015  . Hyperlipidemia 04/15/2015    Past Surgical History:  Procedure Laterality Date  . HERNIA REPAIR  1997   X2  . MASS EXCISION     subcutaneous mass posterior scalp/ Dr Excell Seltzer 2012  . REMOVAL OF LYMPHOMA  2003   OF BACK  . TONSILLECTOMY  1995       Home Medications    Prior to Admission medications   Medication Sig Start Date End Date Taking? Authorizing  Provider  aspirin 81 MG tablet Take 81 mg by mouth daily.   Yes [provider]  Cholecalciferol (VITAMIN D PO) Take 1 tablet by mouth daily.   Yes [provider]  desloratadine (CLARINEX) 5 MG tablet Take 5 mg by mouth daily. 07/25/16  Yes [provider]  fluticasone (FLONASE) 50 MCG/ACT nasal spray 1 SPRAY IN EACH NOSTRIL ONCE A DAY NASALLY 90 DAYS 08/05/16  Yes [provider]  ibuprofen (ADVIL,MOTRIN) 200 MG tablet Take 600 mg by mouth every 6 (six) hours as needed.   Yes [provider]  Multiple Vitamin (MULTIVITAMIN) tablet Take 1 tablet by mouth daily.   Yes [provider]  Omega-3 Fatty Acids (FISH OIL PO) Take 2 tablets by mouth daily.    Yes [provider]  oxyCODONE-acetaminophen (PERCOCET/ROXICET) 5-325 MG tablet Take 1-2 tablets by mouth every 6 (six) hours as needed for severe pain. 04/16/16  Yes Davonna Belling, MD  Red Yeast Rice Extract (RED YEAST RICE PO) Take 1 tablet by mouth 2 (two) times daily.   Yes [provider]  vitamin E 400 UNIT capsule Take 400 Units by mouth daily.   Yes [provider]  XIIDRA 5 % SOLN PLACE 1 DROP INTO BOTH EYES TWICE A DAY 03/22/15  Yes [provider]  morphine (MSIR) 15 MG tablet Take 1 tablet (15 mg total) by mouth every 4 (four) hours as needed for  severe pain. 09/02/16   Deno Etienne, DO  ondansetron (ZOFRAN ODT) 4 MG disintegrating tablet 4mg  ODT q4 hours prn nausea/vomit 09/02/16   Deno Etienne, DO  tamsulosin (FLOMAX) 0.4 MG CAPS capsule Take 1 capsule (0.4 mg total) by mouth daily after supper. 09/02/16   Deno Etienne, DO    Family History Family History  Problem Relation Age of Onset  . Diabetes Mellitus II Mother   . Cirrhosis Mother   . Liver disease Mother   . Heart attack Father 33       CABG   . Diabetes Mellitus II Father   . Liver disease Father   . Healthy Sister   . Pulmonary embolism Brother   . Healthy Daughter   . Healthy Son      Social History Social History  Substance Use Topics  . Smoking status: Never Smoker  . Smokeless tobacco: Not on file  . Alcohol use Not on file     Allergies   Penicillins and Trimox [amoxicillin]   Review of Systems Review of Systems  Constitutional: Negative for chills and fever.  HENT: Negative for congestion and facial swelling.   Eyes: Negative for discharge and visual disturbance.  Respiratory: Negative for shortness of breath.   Cardiovascular: Negative for chest pain and palpitations.  Gastrointestinal: Negative for abdominal pain, diarrhea and vomiting.  Genitourinary: Positive for flank pain.  Musculoskeletal: Positive for arthralgias and myalgias.  Skin: Negative for color change and rash.  Neurological: Negative for tremors, syncope and headaches.  Psychiatric/Behavioral: Negative for confusion and dysphoric mood.     Physical Exam Updated Vital Signs BP 136/86   Pulse 84   Temp 98.3 F (36.8 C) (Oral)   Resp 16   SpO2 97%   Physical Exam  Constitutional: He is oriented to person, place, and time. He appears well-developed and well-nourished.  obese  HENT:  Head: Normocephalic and atraumatic.  Eyes: EOM are normal. Pupils are equal, round, and reactive to light.  Neck: Normal range of motion. Neck supple. No JVD present.  Cardiovascular: Normal rate and regular rhythm.  Exam reveals no gallop and no friction rub.   No murmur heard. Pulmonary/Chest: No respiratory distress. He has no wheezes.  Abdominal: He exhibits no distension and no mass. There is no tenderness. There is no rebound and no guarding.  Musculoskeletal: Normal range of motion.  Neurological: He is alert and oriented to person, place, and time.  Skin: No rash noted. No pallor.  Psychiatric: He has a normal mood and affect. His behavior is normal.  Nursing note and vitals reviewed.    ED Treatments / Results  Labs (all labs ordered are listed, but only abnormal results are  displayed) Labs Reviewed  COMPREHENSIVE METABOLIC PANEL - Abnormal; Notable for the following:       Result Value   Glucose, Bld 117 (*)    Creatinine, Ser 1.79 (*)    GFR calc non Af Amer 40 (*)    GFR calc Af Amer 46 (*)    All other components within normal limits  URINALYSIS, ROUTINE W REFLEX MICROSCOPIC - Abnormal; Notable for the following:    Leukocytes, UA TRACE (*)    All other components within normal limits  LIPASE, BLOOD  CBC    EKG  EKG Interpretation None       Radiology Ct Renal Stone Study  Result Date: 09/02/2016 CLINICAL DATA:  Left flank pain for 5 days. EXAM: CT ABDOMEN AND PELVIS WITHOUT CONTRAST TECHNIQUE: Multidetector  CT imaging of the abdomen and pelvis was performed following the standard protocol without IV contrast. COMPARISON:  04/16/2016 unenhanced CT abdomen/pelvis. FINDINGS: Lower chest: No significant pulmonary nodules or acute consolidative airspace disease. Left anterior descending coronary atherosclerosis. Hepatobiliary: Diffuse hepatic steatosis. No definite liver surface irregularity. No liver mass. Normal gallbladder with no radiopaque cholelithiasis. No biliary ductal dilatation. Pancreas: Normal, with no mass or duct dilation. Spleen: Normal size. No mass. Adrenals/Urinary Tract: Right adrenal 1.2 cm nodule with density 23 HU (series 2/ image 28), stable since 04/16/2016. No additional adrenal nodules. No right renal stones. No right hydronephrosis. Normal caliber right ureter, with no right ureteral stones. Obstructing 4 mm distal left pelvic ureteral stone (located approximately 2 cm above the left UVJ), with mild left hydroureteronephrosis. Additional nonobstructing 2 mm lower left renal stone. No additional left renal or left ureteral stones. No contour deforming renal masses. Mild symmetric perinephric fat stranding. Collapsed and grossly normal bladder. Stomach/Bowel: Grossly normal stomach. Normal caliber small bowel with no small bowel wall  thickening. Normal appendix . Scattered minimal colonic diverticulosis, with no large bowel wall thickening or pericolonic fat stranding. Vascular/Lymphatic: Atherosclerotic nonaneurysmal abdominal aorta. No pathologically enlarged lymph nodes in the abdomen or pelvis. Reproductive: Top-normal size prostate with nonspecific internal prostatic calcifications. Other: No pneumoperitoneum, ascites or focal fluid collection. Stable small fat containing umbilical hernia. Musculoskeletal: No aggressive appearing focal osseous lesions. Stable small sclerotic left sacral lesion, nonspecific, probably a benign bone island. Mild thoracolumbar spondylosis. IMPRESSION: 1. Obstructing 4 mm distal left pelvic ureteral stone (located 2 cm above the left UVJ), with mild left hydroureteronephrosis. Additional punctate nonobstructing lower left renal stone. 2. Right adrenal 1.2 cm nodule with indeterminate density, for which 4 month size stability has been demonstrated, presumably a benign right adrenal adenoma. Consider a follow-up adrenal protocol CT abdomen without and with IV contrast in 12 months. This recommendation follows ACR consensus guidelines: Management of Incidental Adrenal Masses: A White Paper of the ACR Incidental Findings Committee. J Am Coll Radiol 2017;14:1038-1044. 3. Chronic findings include aortic atherosclerosis, coronary atherosclerosis, diffuse hepatic steatosis and minimal colonic diverticulosis. Electronically Signed   By: Ilona Sorrel M.D.   On: 09/02/2016 10:33    Procedures Procedures (including critical care time)  Medications Ordered in ED Medications - No data to display   Initial Impression / Assessment and Plan / ED Course  I have reviewed the triage vital signs and the nursing notes.  Pertinent labs & imaging results that were available during my care of the patient were reviewed by me and considered in my medical decision making (see chart for details).     61 yo M with a chief  complaint of left flank pain. Will obtain a CT scan to evaluate.  CT with a 4 mm left UVJ stone. Patient continues to feel well. Not infected. Mild AKI. Discussed to increase his fluids at home. Given urology follow-up. The surgical  1:46 PM:  I have discussed the diagnosis/risks/treatment options with the patient and family and believe the pt to be eligible for discharge home to follow-up with PCP. We also discussed returning to the ED immediately if new or worsening sx occur. We discussed the sx which are most concerning (e.g., sudden worsening pain, fever, inability to tolerate by mouth) that necessitate immediate return. Medications administered to the patient during their visit and any new prescriptions provided to the patient are listed below.  Medications given during this visit Medications - No data to display  The patient appears reasonably screen and/or stabilized for discharge and I doubt any other medical condition or other [facility] requiring further screening, evaluation, or treatment in the ED at this time prior to discharge.    Final Clinical Impressions(s) / ED Diagnoses   Final diagnoses:  Nephrolithiasis  AKI (acute kidney injury) ([place])  Adrenal nodule ([place])    New Prescriptions Discharge Medication List as of 09/02/2016 10:48 AM    START taking these medications   Details  morphine (MSIR) 15 MG tablet Take 1 tablet (15 mg total) by mouth every 4 (four) hours as needed for severe pain., Starting Wed 09/02/2016, Print    ondansetron (ZOFRAN ODT) 4 MG disintegrating tablet 4mg  ODT q4 hours prn nausea/vomit, Print         Deno Etienne, DO 09/02/16 1346

## 2016-09-02 NOTE — Discharge Instructions (Signed)
You were found to have a right adrenal nodule. Radiology recommends repeat imaging study in a year.  Take 4 over the counter ibuprofen tablets 3 times a day or 2 over-the-counter naproxen tablets twice a day for pain. Also take tylenol 1000mg (2 extra strength) four times a day.   Then take the pain medicine if you feel like you need it. Narcotics do not help with the pain, they only make you care about it less.  You can become addicted to this, people may break into your house to steal it.  It will constipate you.  If you drive under the influence of this medicine you can get a DUI.

## 2016-09-02 NOTE — ED Triage Notes (Signed)
He c/o left flank area pain; worse at night for the past few days. His pcp placed him on dual antibiotic therapy; but he states "when I thought is was a kidney stone I stopped taking the antibiotics". He also cites bloating and recent weight gain of ~ 3 lbs.

## 2016-09-09 ENCOUNTER — Other Ambulatory Visit: Payer: Self-pay | Admitting: Urology

## 2016-09-09 DIAGNOSIS — N202 Calculus of kidney with calculus of ureter: Secondary | ICD-10-CM | POA: Diagnosis not present

## 2016-09-10 ENCOUNTER — Encounter (HOSPITAL_BASED_OUTPATIENT_CLINIC_OR_DEPARTMENT_OTHER): Payer: Self-pay | Admitting: *Deleted

## 2016-09-11 ENCOUNTER — Encounter (HOSPITAL_BASED_OUTPATIENT_CLINIC_OR_DEPARTMENT_OTHER): Payer: Self-pay | Admitting: *Deleted

## 2016-09-11 NOTE — Progress Notes (Signed)
NPO AFTER MN W/ EXCEPTION CLEAR LIQUIDS UNTIL 0700 (NO CREAM/ MILK PRODUCTS).  ARRIVE AT 1115.  NEEDS KUB AND EKG.  CURRENT LAB RESULTS IN CHART AND EPIC.  MAY TAKE TRAMADOL OR OXYCODONE (ONE OR THE OTHER)/ ZOFRAN AM DOS W/ SIPS OF WATER.

## 2016-09-19 NOTE — H&P (Signed)
H&P  Chief Complaint: Kidney stone  History of Present Illness:   He initially presented to the emergency room in early February 2018 and was diagnosed with a left mid/lower ureteral stone as well as a left renal stone. He became asymptomatic. He then presented again to the emergency room in early June, 2018 with similar pain. It was thought that he had a stone again, and he was sent home with pain medicine. 2-3 days later after 3 pounds of weight gain, he presented again where CT scan revealed a similar left renal stone and his left ureteral stone to be more distal.  Because of nonprogression of his ureteral stone and renal stone, he presents for ureteroscopic management of both of these.   Past Medical History:  Diagnosis Date  . Adrenal adenoma, right   . Chronically dry eyes, bilateral   . Diverticulosis    per pt  . Fatty liver    per pt liver bx 11-05-2006  . Hyperlipidemia   . Left ureteral stone   . Pre-diabetes   . Renal calculus, left   . Wears glasses     Past Surgical History:  Procedure Laterality Date  . CARDIOVASCULAR STRESS TEST  04-22-2015  dr Marlou Porch   Intermediate nuclear study w/ no ischemia and medium defect with moderate severity (consistent with artifact)/  normal wall motion,  stress ef 47%  . COLONOSCOPY  2009  . EXCISION SUBCUTANEOUS MASS, POSTERIOR SCALP  11-18-2001  dr Excell Seltzer   lipoma  . INGUINAL HERNIA REPAIR Bilateral 1997  . TONSILLECTOMY  1664  . TRANSTHORACIC ECHOCARDIOGRAM  06/03/2015  dr Marlou Porch   grade 1 diastolic dysfunction, ef 16-10%/  trivial MR and TR/  atrial septum aneurysm/      Home Medications:  Allergies as of 09/19/2016      Reactions   Penicillins Hives   Has patient had a PCN reaction causing immediate rash, facial/tongue/throat swelling, SOB or lightheadedness with hypotension: no Has patient had a PCN reaction causing severe rash involving mucus membranes or skin necrosis: yes Has patient had a PCN reaction that required  hospitalization: no Has patient had a PCN reaction occurring within the last 10 years: no If all of the above answers are "NO", then may proceed with Cephalosporin use.   Trimox [amoxicillin] Hives      Medication List    Notice   Cannot display discharge medications because the patient has not yet been admitted.     Allergies:  Allergies  Allergen Reactions  . Penicillins Hives    Has patient had a PCN reaction causing immediate rash, facial/tongue/throat swelling, SOB or lightheadedness with hypotension: no Has patient had a PCN reaction causing severe rash involving mucus membranes or skin necrosis: yes Has patient had a PCN reaction that required hospitalization: no Has patient had a PCN reaction occurring within the last 10 years: no If all of the above answers are "NO", then may proceed with Cephalosporin use.   . Trimox [Amoxicillin] Hives    Family History  Problem Relation Age of Onset  . Diabetes Mellitus II Mother   . Cirrhosis Mother   . Liver disease Mother   . Heart attack Father 54       CABG   . Diabetes Mellitus II Father   . Liver disease Father   . Healthy Sister   . Pulmonary embolism Brother   . Healthy Daughter   . Healthy Son     Social History:  reports that he has never  smoked. He quit smokeless tobacco use about 33 years ago. His smokeless tobacco use included Snuff. He reports that he drinks alcohol. He reports that he does not use drugs.  ROS: GU Review Male: Patient reports frequent urination, get up at night to urinate, and erection problems. Patient denies hard to postpone urination, burning/ pain with urination, leakage of urine, stream starts and stops, trouble starting your stream, have to strain to urinate , and penile pain. Gastrointestinal (Upper): Patient denies nausea, vomiting, and indigestion/ heartburn. Gastrointestinal (Lower): Patient denies diarrhea and constipation. Constitutional: Patient denies fever, night sweats, weight  loss, and fatigue. Skin: Patient denies skin rash/ lesion and itching. Eyes: Patient denies blurred vision and double vision. Ears/ Nose/ Throat: Patient denies sore throat and sinus problems. Hematologic/Lymphatic: Patient denies swollen glands and easy bruising. Cardiovascular: Patient denies chest pains and leg swelling. Respiratory: Patient denies cough and shortness of breath. Endocrine: Patient denies excessive thirst. Musculoskeletal: Patient denies back pain and joint pain. Neurological: Patient denies headaches and dizziness. Psychologic: Patient denies depression and anxiety.   Physical Exam:  Vital signs in last 24 hours:   Constitutional:  Alert and oriented, No acute distress Cardiovascular: Regular rate and rhythm, No JVD Respiratory: Normal respiratory effort, Lungs clear bilaterally GI: Abdomen is soft, nontender, nondistended, no abdominal masses Genitourinary: No CVAT. Normal male phallus, testes are descended bilaterally and non-tender and without masses, scrotum is normal in appearance without lesions or masses, perineum is normal on inspection.   Lymphatic: No lymphadenopathy Neurologic: Grossly intact, no focal deficits Psychiatric: Normal mood and affect  Laboratory Data:  No results for input(s): WBC, HGB, HCT, PLT in the last 72 hours.  No results for input(s): NA, K, CL, GLUCOSE, BUN, CALCIUM, CREATININE in the last 72 hours.  Invalid input(s): CO3   No results found for this or any previous visit (from the past 24 hour(s)). No results found for this or any previous visit (from the past 240 hour(s)).  Renal Function: No results for input(s): CREATININE in the last 168 hours. Estimated Creatinine Clearance: 53.2 mL/min (A) (by C-G formula based on SCr of 1.79 mg/dL (H)).  Radiologic Imaging: No results found.  Impression/Assessment:  Left ureteral and renal calculi  Plan:  Cystoscopy, left retrograde ureteropyelogram, left ureteroscopy, holmium laser  lithotripsy and extraction of left ureteral and renal calculi, left double-J stent placement

## 2016-09-21 ENCOUNTER — Ambulatory Visit (HOSPITAL_BASED_OUTPATIENT_CLINIC_OR_DEPARTMENT_OTHER)
Admission: RE | Admit: 2016-09-21 | Discharge: 2016-09-21 | Disposition: A | Discharge status: Home / Self Care | Payer: BLUE CROSS/BLUE SHIELD | Source: Ambulatory Visit | Attending: Urology | Admitting: Urology

## 2016-09-21 ENCOUNTER — Encounter (HOSPITAL_BASED_OUTPATIENT_CLINIC_OR_DEPARTMENT_OTHER): Payer: Self-pay | Admitting: *Deleted

## 2016-09-21 ENCOUNTER — Ambulatory Visit (HOSPITAL_BASED_OUTPATIENT_CLINIC_OR_DEPARTMENT_OTHER): Payer: BLUE CROSS/BLUE SHIELD | Admitting: Anesthesiology

## 2016-09-21 ENCOUNTER — Ambulatory Visit (HOSPITAL_COMMUNITY): Payer: BLUE CROSS/BLUE SHIELD

## 2016-09-21 ENCOUNTER — Encounter (HOSPITAL_BASED_OUTPATIENT_CLINIC_OR_DEPARTMENT_OTHER)
Admission: RE | Disposition: A | Discharge status: Home / Self Care | Payer: Self-pay | Source: Ambulatory Visit | Attending: Urology

## 2016-09-21 DIAGNOSIS — Z880 Allergy status to penicillin: Secondary | ICD-10-CM | POA: Insufficient documentation

## 2016-09-21 DIAGNOSIS — R7303 Prediabetes: Secondary | ICD-10-CM | POA: Insufficient documentation

## 2016-09-21 DIAGNOSIS — N202 Calculus of kidney with calculus of ureter: Secondary | ICD-10-CM | POA: Diagnosis not present

## 2016-09-21 DIAGNOSIS — Z833 Family history of diabetes mellitus: Secondary | ICD-10-CM | POA: Diagnosis not present

## 2016-09-21 DIAGNOSIS — Z87891 Personal history of nicotine dependence: Secondary | ICD-10-CM | POA: Diagnosis not present

## 2016-09-21 DIAGNOSIS — N201 Calculus of ureter: Secondary | ICD-10-CM | POA: Diagnosis not present

## 2016-09-21 DIAGNOSIS — E785 Hyperlipidemia, unspecified: Secondary | ICD-10-CM | POA: Insufficient documentation

## 2016-09-21 DIAGNOSIS — N200 Calculus of kidney: Secondary | ICD-10-CM | POA: Diagnosis not present

## 2016-09-21 DIAGNOSIS — Z8249 Family history of ischemic heart disease and other diseases of the circulatory system: Secondary | ICD-10-CM | POA: Diagnosis not present

## 2016-09-21 DIAGNOSIS — E119 Type 2 diabetes mellitus without complications: Secondary | ICD-10-CM | POA: Diagnosis not present

## 2016-09-21 DIAGNOSIS — Z466 Encounter for fitting and adjustment of urinary device: Secondary | ICD-10-CM | POA: Diagnosis not present

## 2016-09-21 DIAGNOSIS — Z8489 Family history of other specified conditions: Secondary | ICD-10-CM | POA: Insufficient documentation

## 2016-09-21 HISTORY — PX: HOLMIUM LASER APPLICATION: SHX5852

## 2016-09-21 HISTORY — PX: CYSTOSCOPY WITH RETROGRADE PYELOGRAM, URETEROSCOPY AND STENT PLACEMENT: SHX5789

## 2016-09-21 HISTORY — DX: Calculus of ureter: N20.1

## 2016-09-21 HISTORY — DX: Diverticulosis of intestine, part unspecified, without perforation or abscess without bleeding: K57.90

## 2016-09-21 HISTORY — DX: Presence of spectacles and contact lenses: Z97.3

## 2016-09-21 HISTORY — DX: Benign neoplasm of right adrenal gland: D35.01

## 2016-09-21 HISTORY — DX: Prediabetes: R73.03

## 2016-09-21 HISTORY — DX: Calculus of kidney: N20.0

## 2016-09-21 HISTORY — DX: Dry eye syndrome of bilateral lacrimal glands: H04.123

## 2016-09-21 LAB — GLUCOSE, CAPILLARY: GLUCOSE-CAPILLARY: 92 mg/dL (ref 65–99)

## 2016-09-21 SURGERY — CYSTOURETEROSCOPY, WITH RETROGRADE PYELOGRAM AND STENT INSERTION
Anesthesia: General | Laterality: Left

## 2016-09-21 MED ORDER — TAMSULOSIN HCL 0.4 MG PO CAPS
ORAL_CAPSULE | ORAL | Status: AC
  Filled 2016-09-21: qty 1

## 2016-09-21 MED ORDER — FENTANYL CITRATE (PF) 100 MCG/2ML IJ SOLN
INTRAMUSCULAR | Status: DC | PRN
  Administered 2016-09-21 (×2): 25 ug via INTRAVENOUS
  Administered 2016-09-21: 50 ug via INTRAVENOUS

## 2016-09-21 MED ORDER — MIDAZOLAM HCL 2 MG/2ML IJ SOLN
INTRAMUSCULAR | Status: AC
  Filled 2016-09-21: qty 2

## 2016-09-21 MED ORDER — PROPOFOL 10 MG/ML IV BOLUS
INTRAVENOUS | Status: AC
  Filled 2016-09-21: qty 40

## 2016-09-21 MED ORDER — SULFAMETHOXAZOLE-TRIMETHOPRIM 800-160 MG PO TABS: 1.0000 | ORAL_TABLET | Freq: Two times a day (BID) | ORAL | 0 refills | Status: DC

## 2016-09-21 MED ORDER — OXYBUTYNIN CHLORIDE 5 MG PO TABS: 5.0000 mg | ORAL_TABLET | Freq: Three times a day (TID) | ORAL | 1 refills | Status: DC

## 2016-09-21 MED ORDER — FENTANYL CITRATE (PF) 100 MCG/2ML IJ SOLN
25.0000 ug | INTRAMUSCULAR | Status: DC | PRN
  Filled 2016-09-21: qty 1

## 2016-09-21 MED ORDER — LIDOCAINE 2% (20 MG/ML) 5 ML SYRINGE
INTRAMUSCULAR | Status: AC
  Filled 2016-09-21: qty 5

## 2016-09-21 MED ORDER — KETOROLAC TROMETHAMINE 30 MG/ML IJ SOLN
INTRAMUSCULAR | Status: DC | PRN
  Administered 2016-09-21: 30 mg via INTRAVENOUS

## 2016-09-21 MED ORDER — MIDAZOLAM HCL 5 MG/5ML IJ SOLN
INTRAMUSCULAR | Status: DC | PRN
  Administered 2016-09-21: 2 mg via INTRAVENOUS

## 2016-09-21 MED ORDER — ONDANSETRON HCL 4 MG/2ML IJ SOLN
INTRAMUSCULAR | Status: AC
  Filled 2016-09-21: qty 2

## 2016-09-21 MED ORDER — DEXAMETHASONE SODIUM PHOSPHATE 10 MG/ML IJ SOLN
INTRAMUSCULAR | Status: AC
  Filled 2016-09-21: qty 1

## 2016-09-21 MED ORDER — OXYBUTYNIN CHLORIDE 5 MG PO TABS
5.0000 mg | ORAL_TABLET | Freq: Once | ORAL | Status: AC
  Administered 2016-09-21: 5 mg via ORAL
  Filled 2016-09-21: qty 1

## 2016-09-21 MED ORDER — ONDANSETRON HCL 4 MG/2ML IJ SOLN
INTRAMUSCULAR | Status: DC | PRN
  Administered 2016-09-21: 4 mg via INTRAVENOUS

## 2016-09-21 MED ORDER — LACTATED RINGERS IV SOLN
INTRAVENOUS | Status: DC
  Administered 2016-09-21 (×3): via INTRAVENOUS
  Filled 2016-09-21: qty 1000

## 2016-09-21 MED ORDER — PROPOFOL 10 MG/ML IV BOLUS
INTRAVENOUS | Status: DC | PRN
  Administered 2016-09-21: 250 mg via INTRAVENOUS

## 2016-09-21 MED ORDER — LIDOCAINE 2% (20 MG/ML) 5 ML SYRINGE
INTRAMUSCULAR | Status: DC | PRN
  Administered 2016-09-21: 60 mg via INTRAVENOUS

## 2016-09-21 MED ORDER — OXYCODONE HCL 5 MG PO TABS
ORAL_TABLET | ORAL | Status: AC
  Filled 2016-09-21: qty 1

## 2016-09-21 MED ORDER — ONDANSETRON HCL 4 MG/2ML IJ SOLN
4.0000 mg | Freq: Four times a day (QID) | INTRAMUSCULAR | Status: DC | PRN
  Filled 2016-09-21: qty 2

## 2016-09-21 MED ORDER — OXYBUTYNIN CHLORIDE 5 MG PO TABS
ORAL_TABLET | ORAL | Status: AC
  Filled 2016-09-21: qty 1

## 2016-09-21 MED ORDER — OXYCODONE HCL 5 MG/5ML PO SOLN
5.0000 mg | Freq: Once | ORAL | Status: AC | PRN
  Filled 2016-09-21: qty 5

## 2016-09-21 MED ORDER — OXYCODONE HCL 5 MG PO TABS
5.0000 mg | ORAL_TABLET | Freq: Once | ORAL | Status: AC | PRN
  Administered 2016-09-21: 5 mg via ORAL
  Filled 2016-09-21: qty 1

## 2016-09-21 MED ORDER — CIPROFLOXACIN IN D5W 400 MG/200ML IV SOLN
400.0000 mg | INTRAVENOUS | Status: AC
  Administered 2016-09-21: 400 mg via INTRAVENOUS
  Filled 2016-09-21: qty 200

## 2016-09-21 MED ORDER — FENTANYL CITRATE (PF) 100 MCG/2ML IJ SOLN
INTRAMUSCULAR | Status: AC
  Filled 2016-09-21: qty 2

## 2016-09-21 MED ORDER — CIPROFLOXACIN IN D5W 400 MG/200ML IV SOLN
INTRAVENOUS | Status: AC
  Filled 2016-09-21: qty 200

## 2016-09-21 MED ORDER — KETOROLAC TROMETHAMINE 30 MG/ML IJ SOLN
INTRAMUSCULAR | Status: AC
  Filled 2016-09-21: qty 1

## 2016-09-21 MED ORDER — DEXAMETHASONE SODIUM PHOSPHATE 4 MG/ML IJ SOLN
INTRAMUSCULAR | Status: DC | PRN
  Administered 2016-09-21: 10 mg via INTRAVENOUS

## 2016-09-21 MED ORDER — TAMSULOSIN HCL 0.4 MG PO CAPS
0.4000 mg | ORAL_CAPSULE | Freq: Once | ORAL | Status: AC
  Administered 2016-09-21: 0.4 mg via ORAL
  Filled 2016-09-21: qty 1

## 2016-09-21 SURGICAL SUPPLY — 39 items
BAG DRAIN URO-CYSTO SKYTR STRL (DRAIN) ×2 IMPLANT
BAG DRN UROCATH (DRAIN) ×1
BASKET LASER NITINOL 1.9FR (BASKET) IMPLANT
BASKET STNLS GEMINI 4WIRE 3FR (BASKET) IMPLANT
BASKET ZERO TIP NITINOL 2.4FR (BASKET) IMPLANT
BSKT STON RTRVL 120 1.9FR (BASKET)
BSKT STON RTRVL GEM 120X11 3FR (BASKET)
BSKT STON RTRVL ZERO TP 2.4FR (BASKET)
CATH INTERMIT  6FR 70CM (CATHETERS) ×2 IMPLANT
CATH URET 5FR 28IN CONE TIP (BALLOONS)
CATH URET 5FR 28IN OPEN ENDED (CATHETERS) IMPLANT
CATH URET 5FR 70CM CONE TIP (BALLOONS) IMPLANT
CLOTH BEACON ORANGE TIMEOUT ST (SAFETY) ×2 IMPLANT
ELECT REM PT RETURN 9FT ADLT (ELECTROSURGICAL)
ELECTRODE REM PT RTRN 9FT ADLT (ELECTROSURGICAL) IMPLANT
EXTRACTOR STONE NITINOL NGAGE (UROLOGICAL SUPPLIES) ×1 IMPLANT
FIBER LASER FLEXIVA 200 (UROLOGICAL SUPPLIES) ×1 IMPLANT
FIBER LASER FLEXIVA 365 (UROLOGICAL SUPPLIES) IMPLANT
FIBER LASER TRAC TIP (UROLOGICAL SUPPLIES) IMPLANT
GLOVE BIO SURGEON STRL SZ8 (GLOVE) ×2 IMPLANT
GOWN STRL REUS W/ TWL LRG LVL3 (GOWN DISPOSABLE) ×1 IMPLANT
GOWN STRL REUS W/ TWL XL LVL3 (GOWN DISPOSABLE) ×1 IMPLANT
GOWN STRL REUS W/TWL LRG LVL3 (GOWN DISPOSABLE) ×2
GOWN STRL REUS W/TWL XL LVL3 (GOWN DISPOSABLE) ×2
GUIDEWIRE 0.038 PTFE COATED (WIRE) IMPLANT
GUIDEWIRE ANG ZIPWIRE 038X150 (WIRE) IMPLANT
GUIDEWIRE STR DUAL SENSOR (WIRE) IMPLANT
IV NS IRRIG 3000ML ARTHROMATIC (IV SOLUTION) ×4 IMPLANT
KIT BALLIN UROMAX 15FX10 (LABEL) IMPLANT
KIT BALLN UROMAX 15FX4 (MISCELLANEOUS) IMPLANT
KIT BALLN UROMAX 26 75X4 (MISCELLANEOUS)
KIT RM TURNOVER CYSTO AR (KITS) ×2 IMPLANT
LASER FIBER DISP (UROLOGICAL SUPPLIES) IMPLANT
MANIFOLD NEPTUNE II (INSTRUMENTS) ×1 IMPLANT
PACK CYSTO (CUSTOM PROCEDURE TRAY) ×2 IMPLANT
SET HIGH PRES BAL DIL (LABEL)
SHEATH ACCESS URETERAL 38CM (SHEATH) ×1 IMPLANT
STENT URET 6FRX24 CONTOUR (STENTS) ×1 IMPLANT
TUBE CONNECTING 12X1/4 (SUCTIONS) IMPLANT

## 2016-09-21 NOTE — Discharge Instructions (Signed)
POSTOPERATIVE CARE AFTER URETEROSCOPY  Stent management  *Stents are often left in after ureteroscopy and stone treatment. If left in, they often cause urinary frequency, urgency, occasional blood in the urine, as well as flank discomfort with urination. These are all expected issues, and should resolve after the stent is removed. *Often times, a small thread is left on the end of the stent, and brought out through the urethra. If so, this is used to remove the stent, making it unnecessary to look in the bladder with a scope in the office to remove the stent. If a thread is left on, did not pull on it until instructed.  It is okay to pull the thread to remove the stent on Thursday morning.  Diet  Once you have adequately recovered from anesthesia, you may gradually advance your diet, as tolerated, to your regular diet.  Activities  You may gradually increase your activities to your normal unrestricted level the day following your procedure.  Medications  You should resume all preoperative medications. If you are on aspirin-like compounds, you should not resume these until the blood clears from your urine. If given an antibiotic by the surgeon, take these until they are completed. You may also be given, if you have a stent, medications to decrease the urinary frequency and urgency.  Pain  After ureteroscopy, there may be some pain on the side of the scope. Take your pain medicine for this. Usually, this pain resolves within a day or 2.  Fever  Please report any fever over 100 to the doctor.  Post Anesthesia Home Care Instructions  Activity: Get plenty of rest for the remainder of the day. A responsible individual must stay with you for 24 hours following the procedure.  For the next 24 hours, DO NOT: -Drive a car -Paediatric nurse -Drink alcoholic beverages -Take any medication unless instructed by your physician -Make any legal decisions or sign important  papers.  Meals: Start with liquid foods such as gelatin or soup. Progress to regular foods as tolerated. Avoid greasy, spicy, heavy foods. If nausea and/or vomiting occur, drink only clear liquids until the nausea and/or vomiting subsides. Call your physician if vomiting continues.  Special Instructions/Symptoms: Your throat may feel dry or sore from the anesthesia or the breathing tube placed in your throat during surgery. If this causes discomfort, gargle with warm salt water. The discomfort should disappear within 24 hours.  If you had a scopolamine patch placed behind your ear for the management of post- operative nausea and/or vomiting:  1. The medication in the patch is effective for 72 hours, after which it should be removed.  Wrap patch in a tissue and discard in the trash. Wash hands thoroughly with soap and water. 2. You may remove the patch earlier than 72 hours if you experience unpleasant side effects which may include dry mouth, dizziness or visual disturbances. 3. Avoid touching the patch. Wash your hands with soap and water after contact with the patch.

## 2016-09-21 NOTE — Anesthesia Preprocedure Evaluation (Addendum)
Anesthesia Evaluation  Patient identified by MRN, date of birth, ID band Patient awake    Reviewed: Allergy & Precautions, H&P , NPO status , Patient's Chart, lab work & pertinent test results  Airway Mallampati: II   Neck ROM: full    Dental  (+) Teeth Intact, Dental Advisory Given, Caps   Pulmonary neg pulmonary ROS,    breath sounds clear to auscultation       Cardiovascular Exercise Tolerance: Good negative cardio ROS   Rhythm:regular Rate:Normal     Neuro/Psych    GI/Hepatic   Endo/Other  obese  Renal/GU Renal diseasestones     Musculoskeletal   Abdominal   Peds  Hematology   Anesthesia Other Findings   Reproductive/Obstetrics                           Anesthesia Physical Anesthesia Plan  ASA: II  Anesthesia Plan: General   Post-op Pain Management:    Induction: Intravenous  PONV Risk Score and Plan: 2 and Ondansetron, Dexamethasone and Treatment may vary due to age or medical condition  Airway Management Planned: LMA  Additional Equipment:   Intra-op Plan:   Post-operative Plan:   Informed Consent: I have reviewed the patients History and Physical, chart, labs and discussed the procedure including the risks, benefits and alternatives for the proposed anesthesia with the patient or authorized representative who has indicated his/her understanding and acceptance.     Plan Discussed with: CRNA, Anesthesiologist and Surgeon  Anesthesia Plan Comments:         Anesthesia Quick Evaluation

## 2016-09-21 NOTE — Transfer of Care (Signed)
Immediate Anesthesia Transfer of Care Note  Patient: Kyle Baird  Procedure(s) Performed: Procedure(s): CYSTOSCOPY WITH RETROGRADE PYELOGRAM, URETEROSCOPY, STONE EXTRACTION  AND STENT PLACEMENT (Left) HOLMIUM LASER APPLICATION (Left)  Patient Location: PACU  Anesthesia Type:General  Level of Consciousness: awake, alert , oriented and patient cooperative  Airway & Oxygen Therapy: Patient Spontanous Breathing and Patient connected to nasal cannula oxygen  Post-op Assessment: Report given to RN and Post -op Vital signs reviewed and stable  Post vital signs: Reviewed and stable  Last Vitals:  Vitals:   09/21/16 1445 09/21/16 1500  BP: 131/80 132/78  Pulse: 76 79  Resp: 12 12  Temp:      Last Pain:  Vitals:   09/21/16 1116  TempSrc: Oral      Patients Stated Pain Goal: 2 (61/16/43 5391)  Complications: No apparent anesthesia complications

## 2016-09-21 NOTE — Transfer of Care (Deleted)
Immediate Anesthesia Transfer of Care Note  Patient: Kyle Baird  Procedure(s) Performed: Procedure(s): CYSTOSCOPY WITH RETROGRADE PYELOGRAM, URETEROSCOPY, STONE EXTRACTION  AND STENT PLACEMENT (Left) HOLMIUM LASER APPLICATION (Left)  Patient Location: PACU  Anesthesia Type:General  Level of Consciousness: awake, alert , oriented and patient cooperative  Airway & Oxygen Therapy: Patient Spontanous Breathing and Patient connected to nasal cannula oxygen  Post-op Assessment: Report given to RN and Post -op Vital signs reviewed and stable  Post vital signs: Reviewed and stable  Last Vitals:  Vitals:   09/21/16 1445 09/21/16 1500  BP: 131/80 132/78  Pulse: 76 79  Resp: 12 12  Temp:      Last Pain:  Vitals:   09/21/16 1116  TempSrc: Oral      Patients Stated Pain Goal: 2 (16/38/45 3646)  Complications: No apparent anesthesia complications

## 2016-09-21 NOTE — Op Note (Signed)
Preoperative diagnosis: Left distal ureteral stone, left lower pole renal stone  Postoperative diagnosis: Same  Principal procedure: Cystoscopy, left retrograde ureteropyelogram with fluoroscopic interpretation, left ureteroscopy (rigid and flexible), holmium laser lithotripsy of left ureteral stone and extraction of left ureteral stone fragments, extraction of left lower pole renal stone, placement of 24 centimeters 6 French contour double-J stent with tether  Surgeon: Jassica Zazueta  Anesthesia: Gen. with LMA  Complications: None  Estimated blood loss: Less than 10 mL  Drains: 24 centimeter by 6 French contour stent with tether  Indications: 61 year old male with persistent left distal ureteral stone causing symptoms/pain.  There has been nonprogression of the stone over the past month or so.  Patient also has a left lower pole stone.  At this point, we present for cystoscopy, ureteroscopy on the left, laser lithotripsy and extraction of left renal and ureteral calculi.  I discussed the procedure with the patient and his wife.  Risks and complications were also discussed.  The understand these and desire to proceed.  Findings: Urethra was normal.  Prostate was nonobstructive.  The bladder revealed normal urothelium, ureteral orifices were somewhat lateral.  Left retrograde ureteropyelogram, performed with Omnipaque, revealed a filling defect in the left distal ureter consistent with a ureteral calculus.  Proximal ureter was mildly dilated, no further filling defects were seen.  Pyelocalyceal system was normal.  Description of procedure: The patient was properly identified in the holding area.  His left side was marked.  He was then taken to the operating room where general anesthesia was administered with the LMA.  He was placed in the dorsolithotomy position.  Genitalia and perineum were prepped and draped.  Proper timeout was performed.  A 21 French panendoscope was advanced under direct vision  through his urethra.  Urethra was normal.  Prostate nonobstructive.  Following systematic evaluation of the bladder, the left ureteral orifice was cannulated with a 6 [place] open-ended catheter.  Omnipaque was used for retrograde pyelogram, with the results dictated above.  Once the retrograde was finished, I negotiated a 0.038 inch sensor-tip guidewire through the open-ended catheter, up into the upper pole calyceal system.  Once this was properly in place, the ureteral catheter and the cystoscope were removed.  The distal ureter was dilated first with the inner core and then with the entire 12/14 ureteral access catheter.  This was quite easily done.  The access catheter was then removed and the guidewire left in place.  The 6 French dual-lumen semirigid ureteroscope was then advanced under direct vision through the urethra and then up into the ureter where the stone was encountered, perhaps 3-4 centimeters up.  It was too big to extract alone, and was subsequently fragmented using the holmium laser through the 200 micron laser fiber.  Energy was set at 0.8 joules, with 15 hertz.  The stone was fragmented into 2-3 smaller fragments were then easily grasped with the engage basket and dropped into the bladder.  Thorough inspection of the entire ureter.  This point was performed with the ureteroscope, no further ureteral stones were seen.  I then removed the rigid ureteroscope, and advanced the 35 centimeter ureteral access catheter into the proximal ureter over the guidewire.  The core was removed.  Additionally, the guidewire was removed.  The 6 French flexible dual-lumen ureteroscope was then advanced up into the renal pelvis.  Systematic inspection of the entire calyceal system was then performed, from the upper pole to the lower pole.  No stones were seen except for  one small 2-3 millimeters stone in the lower pole calyx.  This was grasped with the engage basket and easily extracted through the access sheath.   There being no further calculi within the pyelocalyceal system, the cystoscope was then removed.  I felt that because of instrumentation and dilation, the ureter should be stented, so I returned the guidewire into the upper pole calyx through the ureteral access catheter, and remove the ureteral access catheter.  The cystoscope was then placed in the bladder and the small stone fragments were removed.  The scope was removed, the guidewire backloaded through the scope, and a 6 [place] by 24 centimeters contour double-J stent was placed into the left ureter using fluoroscopic and cystoscopic guidance.  Following removal of the guidewire to deploy the stent, excellent proximal and distal curls were seen.  Both with the fluoroscope and the cystoscope.  The tether was left on the bladder end of the stent, and brought to the urethra.  The scope was then removed after the bladder was drained.  The stent was not of right outside the urethra, and trimmed.  It was then taped to the patient's penis.  At this point, the procedure was complete.  The patient was awakened and taken to the PACU in stable condition.  He tolerated the procedure well.

## 2016-09-21 NOTE — Anesthesia Procedure Notes (Signed)
Procedure Name: LMA Insertion Date/Time: 09/21/2016 1:27 PM Performed by: Wanita Chamberlain Pre-anesthesia Checklist: Patient identified, Timeout performed, Emergency Drugs available, Suction available and Patient being monitored Patient Re-evaluated:Patient Re-evaluated prior to inductionOxygen Delivery Method: Circle system utilized Preoxygenation: Pre-oxygenation with 100% oxygen Intubation Type: IV induction Ventilation: Mask ventilation without difficulty LMA: LMA inserted LMA Size: 5.0 Number of attempts: 1 Placement Confirmation: positive ETCO2 and breath sounds checked- equal and bilateral Tube secured with: Tape Dental Injury: Teeth and Oropharynx as per pre-operative assessment

## 2016-09-21 NOTE — Interval H&P Note (Signed)
History and Physical Interval Note:  09/21/2016 12:50 PM  Kyle Baird  has presented today for surgery, with the diagnosis of LEFT URETERAL AND RENAL CALCULI  The various methods of treatment have been discussed with the patient and family. After consideration of risks, benefits and other options for treatment, the patient has consented to  Procedure(s): CYSTOSCOPY WITH RETROGRADE PYELOGRAM, URETEROSCOPY, STONE EXTRACTION  AND STENT PLACEMENT (Left) HOLMIUM LASER APPLICATION (Left) as a surgical intervention .  The patient's history has been reviewed, patient examined, no change in status, stable for surgery.  I have reviewed the patient's chart and labs.  Questions were answered to the patient's satisfaction.     Jorja Loa

## 2016-09-21 NOTE — Anesthesia Postprocedure Evaluation (Signed)
Anesthesia Post Note  Patient: JAMS TRICKETT  Procedure(s) Performed: Procedure(s) (LRB): CYSTOSCOPY WITH RETROGRADE PYELOGRAM, URETEROSCOPY, STONE EXTRACTION  AND STENT PLACEMENT (Left) HOLMIUM LASER APPLICATION (Left)     Patient location during evaluation: PACU Anesthesia Type: General Level of consciousness: awake and alert Pain management: pain level controlled Vital Signs Assessment: post-procedure vital signs reviewed and stable Respiratory status: spontaneous breathing, nonlabored ventilation, respiratory function stable and patient connected to nasal cannula oxygen Cardiovascular status: blood pressure returned to baseline and stable Postop Assessment: no signs of nausea or vomiting Anesthetic complications: no    Last Vitals:  Vitals:   09/21/16 1445 09/21/16 1500  BP: 131/80 132/78  Pulse: 76 79  Resp: 12 12  Temp:      Last Pain:  Vitals:   09/21/16 1116  TempSrc: Oral                 Ceyda Peterka

## 2016-09-21 NOTE — Interval H&P Note (Signed)
History and Physical Interval Note:  09/21/2016 12:50 PM  Kyle Baird  has presented today for surgery, with the diagnosis of LEFT URETERAL AND RENAL CALCULI  The various methods of treatment have been discussed with the patient and family. After consideration of risks, benefits and other options for treatment, the patient has consented to  Procedure(s): CYSTOSCOPY WITH RETROGRADE PYELOGRAM, URETEROSCOPY, STONE EXTRACTION  AND STENT PLACEMENT (Left) HOLMIUM LASER APPLICATION (Left) as a surgical intervention .  The patient's history has been reviewed, patient examined, no change in status, stable for surgery.  I have reviewed the patient's chart and labs.  Questions were answered to the patient's satisfaction.     Kyle Baird

## 2016-09-21 NOTE — Progress Notes (Signed)
Dr. Chapman Moss aware of bladder scan 51ml of urine and voiding 253ml since scan. Pt states he usually takes his Flomax around 6pm. Orders given for flomax.

## 2016-09-22 ENCOUNTER — Encounter (HOSPITAL_BASED_OUTPATIENT_CLINIC_OR_DEPARTMENT_OTHER): Payer: Self-pay | Admitting: Urology

## 2016-09-29 DIAGNOSIS — H16223 Keratoconjunctivitis sicca, not specified as Sjogren's, bilateral: Secondary | ICD-10-CM | POA: Diagnosis not present

## 2016-09-29 DIAGNOSIS — H04123 Dry eye syndrome of bilateral lacrimal glands: Secondary | ICD-10-CM | POA: Diagnosis not present

## 2016-09-29 DIAGNOSIS — H25013 Cortical age-related cataract, bilateral: Secondary | ICD-10-CM | POA: Diagnosis not present

## 2016-09-29 DIAGNOSIS — H2513 Age-related nuclear cataract, bilateral: Secondary | ICD-10-CM | POA: Diagnosis not present

## 2016-11-02 DIAGNOSIS — N3281 Overactive bladder: Secondary | ICD-10-CM | POA: Diagnosis not present

## 2016-11-02 DIAGNOSIS — N202 Calculus of kidney with calculus of ureter: Secondary | ICD-10-CM | POA: Diagnosis not present

## 2016-11-08 DIAGNOSIS — S80861A Insect bite (nonvenomous), right lower leg, initial encounter: Secondary | ICD-10-CM | POA: Diagnosis not present

## 2016-11-09 DIAGNOSIS — T63301A Toxic effect of unspecified spider venom, accidental (unintentional), initial encounter: Secondary | ICD-10-CM | POA: Diagnosis not present

## 2017-05-26 DIAGNOSIS — M25559 Pain in unspecified hip: Secondary | ICD-10-CM | POA: Diagnosis not present

## 2017-05-26 DIAGNOSIS — N401 Enlarged prostate with lower urinary tract symptoms: Secondary | ICD-10-CM | POA: Diagnosis not present

## 2017-05-26 DIAGNOSIS — J309 Allergic rhinitis, unspecified: Secondary | ICD-10-CM | POA: Diagnosis not present

## 2017-05-26 DIAGNOSIS — E782 Mixed hyperlipidemia: Secondary | ICD-10-CM | POA: Diagnosis not present

## 2017-05-26 DIAGNOSIS — Z0000 Encounter for general adult medical examination without abnormal findings: Secondary | ICD-10-CM | POA: Diagnosis not present

## 2017-05-26 DIAGNOSIS — R7309 Other abnormal glucose: Secondary | ICD-10-CM | POA: Diagnosis not present

## 2017-06-07 DIAGNOSIS — M25551 Pain in right hip: Secondary | ICD-10-CM | POA: Diagnosis not present

## 2017-07-05 DIAGNOSIS — M25551 Pain in right hip: Secondary | ICD-10-CM | POA: Diagnosis not present

## 2017-08-04 DIAGNOSIS — M25551 Pain in right hip: Secondary | ICD-10-CM | POA: Diagnosis not present

## 2017-08-27 DIAGNOSIS — Z23 Encounter for immunization: Secondary | ICD-10-CM | POA: Diagnosis not present

## 2017-10-07 DIAGNOSIS — H04123 Dry eye syndrome of bilateral lacrimal glands: Secondary | ICD-10-CM | POA: Diagnosis not present

## 2017-10-07 DIAGNOSIS — H2513 Age-related nuclear cataract, bilateral: Secondary | ICD-10-CM | POA: Diagnosis not present

## 2017-10-07 DIAGNOSIS — H16223 Keratoconjunctivitis sicca, not specified as Sjogren's, bilateral: Secondary | ICD-10-CM | POA: Diagnosis not present

## 2017-10-07 DIAGNOSIS — H25013 Cortical age-related cataract, bilateral: Secondary | ICD-10-CM | POA: Diagnosis not present

## 2017-11-12 DIAGNOSIS — Z23 Encounter for immunization: Secondary | ICD-10-CM | POA: Diagnosis not present

## 2017-11-29 DIAGNOSIS — N202 Calculus of kidney with calculus of ureter: Secondary | ICD-10-CM | POA: Diagnosis not present

## 2017-12-24 DIAGNOSIS — E785 Hyperlipidemia, unspecified: Secondary | ICD-10-CM | POA: Diagnosis not present

## 2017-12-24 DIAGNOSIS — R7309 Other abnormal glucose: Secondary | ICD-10-CM | POA: Diagnosis not present

## 2018-05-09 DIAGNOSIS — M25521 Pain in right elbow: Secondary | ICD-10-CM | POA: Diagnosis not present

## 2018-05-09 DIAGNOSIS — M79676 Pain in unspecified toe(s): Secondary | ICD-10-CM | POA: Diagnosis not present

## 2018-08-31 DIAGNOSIS — J069 Acute upper respiratory infection, unspecified: Secondary | ICD-10-CM | POA: Diagnosis not present

## 2018-10-17 DIAGNOSIS — H524 Presbyopia: Secondary | ICD-10-CM | POA: Diagnosis not present

## 2018-10-17 DIAGNOSIS — H04123 Dry eye syndrome of bilateral lacrimal glands: Secondary | ICD-10-CM | POA: Diagnosis not present

## 2018-10-17 DIAGNOSIS — H2513 Age-related nuclear cataract, bilateral: Secondary | ICD-10-CM | POA: Diagnosis not present

## 2018-10-17 DIAGNOSIS — H0102A Squamous blepharitis right eye, upper and lower eyelids: Secondary | ICD-10-CM | POA: Diagnosis not present

## 2018-10-17 DIAGNOSIS — H25013 Cortical age-related cataract, bilateral: Secondary | ICD-10-CM | POA: Diagnosis not present

## 2018-12-15 DIAGNOSIS — E785 Hyperlipidemia, unspecified: Secondary | ICD-10-CM | POA: Diagnosis not present

## 2018-12-15 DIAGNOSIS — Z0000 Encounter for general adult medical examination without abnormal findings: Secondary | ICD-10-CM | POA: Diagnosis not present

## 2018-12-15 DIAGNOSIS — Z125 Encounter for screening for malignant neoplasm of prostate: Secondary | ICD-10-CM | POA: Diagnosis not present

## 2018-12-15 DIAGNOSIS — R7301 Impaired fasting glucose: Secondary | ICD-10-CM | POA: Diagnosis not present

## 2018-12-20 DIAGNOSIS — J309 Allergic rhinitis, unspecified: Secondary | ICD-10-CM | POA: Diagnosis not present

## 2018-12-20 DIAGNOSIS — N401 Enlarged prostate with lower urinary tract symptoms: Secondary | ICD-10-CM | POA: Diagnosis not present

## 2018-12-20 DIAGNOSIS — Z0000 Encounter for general adult medical examination without abnormal findings: Secondary | ICD-10-CM | POA: Diagnosis not present

## 2018-12-20 DIAGNOSIS — E782 Mixed hyperlipidemia: Secondary | ICD-10-CM | POA: Diagnosis not present

## 2019-02-13 ENCOUNTER — Other Ambulatory Visit: Payer: Self-pay

## 2019-02-13 DIAGNOSIS — Z20822 Contact with and (suspected) exposure to covid-19: Secondary | ICD-10-CM

## 2019-02-14 LAB — NOVEL CORONAVIRUS, NAA: SARS-CoV-2, NAA: NOT DETECTED

## 2019-03-01 DIAGNOSIS — M25559 Pain in unspecified hip: Secondary | ICD-10-CM | POA: Diagnosis not present

## 2019-03-01 DIAGNOSIS — R1030 Lower abdominal pain, unspecified: Secondary | ICD-10-CM | POA: Diagnosis not present

## 2019-06-25 DIAGNOSIS — R202 Paresthesia of skin: Secondary | ICD-10-CM | POA: Diagnosis not present

## 2019-06-25 DIAGNOSIS — R55 Syncope and collapse: Secondary | ICD-10-CM | POA: Diagnosis not present

## 2019-10-10 DIAGNOSIS — L814 Other melanin hyperpigmentation: Secondary | ICD-10-CM | POA: Diagnosis not present

## 2019-10-10 DIAGNOSIS — D229 Melanocytic nevi, unspecified: Secondary | ICD-10-CM | POA: Diagnosis not present

## 2019-10-10 DIAGNOSIS — D485 Neoplasm of uncertain behavior of skin: Secondary | ICD-10-CM | POA: Diagnosis not present

## 2019-10-10 DIAGNOSIS — D234 Other benign neoplasm of skin of scalp and neck: Secondary | ICD-10-CM | POA: Diagnosis not present

## 2019-10-10 DIAGNOSIS — L821 Other seborrheic keratosis: Secondary | ICD-10-CM | POA: Diagnosis not present

## 2019-10-10 DIAGNOSIS — D1801 Hemangioma of skin and subcutaneous tissue: Secondary | ICD-10-CM | POA: Diagnosis not present

## 2019-10-19 DIAGNOSIS — H04123 Dry eye syndrome of bilateral lacrimal glands: Secondary | ICD-10-CM | POA: Diagnosis not present

## 2019-10-19 DIAGNOSIS — H2513 Age-related nuclear cataract, bilateral: Secondary | ICD-10-CM | POA: Diagnosis not present

## 2019-10-19 DIAGNOSIS — H25013 Cortical age-related cataract, bilateral: Secondary | ICD-10-CM | POA: Diagnosis not present

## 2019-10-19 DIAGNOSIS — H18593 Other hereditary corneal dystrophies, bilateral: Secondary | ICD-10-CM | POA: Diagnosis not present

## 2019-11-27 DIAGNOSIS — Z23 Encounter for immunization: Secondary | ICD-10-CM | POA: Diagnosis not present

## 2019-11-27 DIAGNOSIS — R55 Syncope and collapse: Secondary | ICD-10-CM | POA: Diagnosis not present

## 2019-11-27 DIAGNOSIS — R519 Headache, unspecified: Secondary | ICD-10-CM | POA: Diagnosis not present

## 2019-12-19 DIAGNOSIS — R7309 Other abnormal glucose: Secondary | ICD-10-CM | POA: Diagnosis not present

## 2019-12-19 DIAGNOSIS — Z0000 Encounter for general adult medical examination without abnormal findings: Secondary | ICD-10-CM | POA: Diagnosis not present

## 2019-12-23 DIAGNOSIS — Z20822 Contact with and (suspected) exposure to covid-19: Secondary | ICD-10-CM | POA: Diagnosis not present

## 2020-01-17 DIAGNOSIS — R7303 Prediabetes: Secondary | ICD-10-CM | POA: Diagnosis not present

## 2020-01-17 DIAGNOSIS — R55 Syncope and collapse: Secondary | ICD-10-CM | POA: Diagnosis not present

## 2020-01-17 DIAGNOSIS — Z0000 Encounter for general adult medical examination without abnormal findings: Secondary | ICD-10-CM | POA: Diagnosis not present

## 2020-01-17 DIAGNOSIS — E782 Mixed hyperlipidemia: Secondary | ICD-10-CM | POA: Diagnosis not present

## 2020-02-15 ENCOUNTER — Other Ambulatory Visit: Payer: Self-pay

## 2020-02-15 ENCOUNTER — Encounter: Payer: Self-pay | Admitting: Cardiology

## 2020-02-15 ENCOUNTER — Ambulatory Visit (INDEPENDENT_AMBULATORY_CARE_PROVIDER_SITE_OTHER): Payer: BC Managed Care – PPO | Admitting: Cardiology

## 2020-02-15 VITALS — BP 120/80 | HR 86 | Ht 69.0 in | Wt 240.0 lb

## 2020-02-15 DIAGNOSIS — R0600 Dyspnea, unspecified: Secondary | ICD-10-CM | POA: Diagnosis not present

## 2020-02-15 DIAGNOSIS — R072 Precordial pain: Secondary | ICD-10-CM

## 2020-02-15 DIAGNOSIS — Z8249 Family history of ischemic heart disease and other diseases of the circulatory system: Secondary | ICD-10-CM | POA: Diagnosis not present

## 2020-02-15 DIAGNOSIS — R55 Syncope and collapse: Secondary | ICD-10-CM | POA: Diagnosis not present

## 2020-02-15 DIAGNOSIS — R0609 Other forms of dyspnea: Secondary | ICD-10-CM

## 2020-02-15 DIAGNOSIS — Z01812 Encounter for preprocedural laboratory examination: Secondary | ICD-10-CM

## 2020-02-15 MED ORDER — METOPROLOL TARTRATE 100 MG PO TABS: 100.0000 mg | ORAL_TABLET | Freq: Once | ORAL | 0 refills | Status: DC

## 2020-02-15 NOTE — Progress Notes (Signed)
Cardiology Office Note:    Date:  02/15/2020   ID:  Kyle Baird, DOB 30-Dec-1955, MRN [id number]  PCP:  Lawerance Cruel, MD  [facility] Cardiologist:  Candee Furbish, MD  [place] Electrophysiologist:  None   Referring MD: Leighton Ruff, MD     History of Present Illness:    Kyle Baird is a 64 y.o. male here for evaluation of syncope at the request of Dr. Melinda Crutch. Also has a family history of myocardial infarction with father dying at age 25 with heart and diabetes with MI at 63 CABG.  I had seen him previously in 2017.  He is an Art gallery manager at [employer].  He had an episode of syncope/presyncope twice. Back in April, received his J&J Covid vaccine, felt tired afterwards.  Was laying on the couch.  He got up from the nap and started to feel sweaty, pale, as though he needed to go to the bathroom, felt as though he was going to faint.  After a few minutes, this resolved.  EMS arrived and he was back to normal.  His blood pressure was low he states.    He had another episode of near syncope in September at a restaurant while eating.  Felt sweaty once again slightly pale but did not faint.    Some of these episodes may have surrounded heavy exertional activity in the yard for instance, could have been dehydrated his wife thinks.    Kyle Baird went over felt fine. Hydrated well then.   2019 weight watchers - 40 pound weight loss.  Unfortunately he put this weight back on during Covid.  Viagra once 6 months. HA.  Be careful with low blood pressure.  He also occasionally will feel chest pressure.  Shortness of breath with activity such as going upstairs.  When he was at [facility] [place] it was challenging for him to get to the top of the stairs.  He used to feel chest tightness also on Sundays.  This was improved after he stopped eating a certain type of cereal.  His father had an MI at age 43.  Father 68. MI.   Takes Crestor 5 mg once a day for  hyperlipidemia. Also on aspirin.  Hemoglobin A1c 6.3 creatinine 1.09 sodium 140 potassium 4.6 ALT 39 LDL 115  Past Medical History:  Diagnosis Date   Adrenal adenoma, right    Chronically dry eyes, bilateral    Diverticulosis    per pt   Fatty liver    per pt liver bx 11-05-2006   Hyperlipidemia    Left ureteral stone    Pre-diabetes    Renal calculus, left    Wears glasses     Past Surgical History:  Procedure Laterality Date   CARDIOVASCULAR STRESS TEST  04-22-2015  dr Marlou Porch   Intermediate nuclear study w/ no ischemia and medium defect with moderate severity (consistent with artifact)/  normal wall motion,  stress ef 47%   COLONOSCOPY  2009   CYSTOSCOPY WITH RETROGRADE PYELOGRAM, URETEROSCOPY AND STENT PLACEMENT Left 09/21/2016   Procedure: CYSTOSCOPY WITH RETROGRADE PYELOGRAM, URETEROSCOPY, STONE EXTRACTION  AND STENT PLACEMENT;  Surgeon: Franchot Gallo, MD;  Location: [facility];  Service: Urology;  Laterality: Left;   EXCISION SUBCUTANEOUS MASS, POSTERIOR SCALP  11-18-2001  dr Excell Seltzer   lipoma   HOLMIUM LASER APPLICATION Left 10/22/2117   Procedure: HOLMIUM LASER APPLICATION;  Surgeon: Franchot Gallo, MD;  Location: [facility];  Service: Urology;  Laterality: Left;   INGUINAL HERNIA REPAIR Bilateral 1997   TONSILLECTOMY  1664   TRANSTHORACIC ECHOCARDIOGRAM  06/03/2015  dr Marlou Porch   grade 1 diastolic dysfunction, ef 43-56%/  trivial MR and TR/  atrial septum aneurysm/      Current Medications: Current Meds  Medication Sig   aspirin EC 81 MG tablet Take 81 mg by mouth daily.   Cholecalciferol (VITAMIN D PO) Take 1 tablet by mouth daily.   desloratadine (CLARINEX) 5 MG tablet Take 5 mg by mouth every morning.    fluticasone (FLONASE) 50 MCG/ACT nasal spray 1 SPRAY IN EACH NOSTRIL ONCE A DAY NASALLY 90 DAYS--  AM   ibuprofen (ADVIL,MOTRIN) 200 MG tablet Take 600 mg by mouth every 6 (six) hours as needed.    Multiple Vitamin (MULTIVITAMIN) tablet Take 1 tablet by mouth daily.   Omega-3 Fatty Acids (FISH OIL PO) Take 2 tablets by mouth daily.    oxybutynin (DITROPAN) 5 MG tablet Take 1 tablet (5 mg total) by mouth 3 (three) times daily.   rosuvastatin (CRESTOR) 5 MG tablet Take 5 mg by mouth daily.   Sodium Chloride, Hypertonic, (SOCHLOR OP) Apply to eye as needed.   tamsulosin (FLOMAX) 0.4 MG CAPS capsule Take 1 capsule (0.4 mg total) by mouth daily after supper.   vitamin E 400 UNIT capsule Take 400 Units by mouth daily.   XIIDRA 5 % SOLN PLACE 1 DROP INTO BOTH EYES TWICE A DAY FOR DRY EYES     Allergies:   Penicillins and Trimox [amoxicillin]   Social History   Socioeconomic History   Marital status: Married    Spouse name: Not on file   Number of children: Not on file   Years of education: Not on file   Highest education level: Not on file  Occupational History   Not on file  Tobacco Use   Smoking status: Never Smoker   Smokeless tobacco: Former Systems developer    Types: Snuff  Vaping Use   Vaping Use: Never used  Substance and Sexual Activity   Alcohol use: Yes    Comment: RARE   Drug use: No   Sexual activity: Not on file  Other Topics Concern   Not on file  Social History Narrative   Not on file   Social Determinants of Health   Financial Resource Strain:    Difficulty of Paying Living Expenses: Not on file  Food Insecurity:    Worried About [place] in the Last Year: Not on file   Ran Out of Food in the Last Year: Not on file  Transportation Needs:    Lack of Transportation (Medical): Not on file   Lack of Transportation (Non-Medical): Not on file  Physical Activity:    Days of Exercise per Week: Not on file   Minutes of Exercise per Session: Not on file  Stress:    Feeling of Stress : Not on file  Social Connections:    Frequency of Communication with Friends and Family: Not on file   Frequency of Social Gatherings with Friends  and Family: Not on file   Attends Religious Services: Not on file   Active Member of Clubs or Organizations: Not on file   Attends Archivist Meetings: Not on file   Marital Status: Not on file     Family History: The patient's family history includes Cirrhosis in his mother; Diabetes Mellitus II in his father and mother; Healthy in his daughter, sister, and son; Heart  attack (age of onset: 48) in his father; Liver disease in his father and mother; Pulmonary embolism in his brother.  ROS:   Please see the history of present illness.     All other systems reviewed and are negative.  EKGs/Labs/Other Studies Reviewed:    The following studies were reviewed today:  NUC 2017: Medium size and intensity, fixed inferoseptal perfusion defect - could be scar or artifact. High liver uptake noted. LVEF 47% with inferoseptal hypokinesis. Clinical correlation with echocardiography is advised. This is an intermediate risk study.   EKG:  EKG is  ordered today.  The ekg ordered today demonstrates sinus rhythm 86 with no other abnormalities.  Recent Labs: No results found for requested labs within last 8760 hours.  Recent Lipid Panel No results found for: CHOL, TRIG, HDL, CHOLHDL, VLDL, LDLCALC, LDLDIRECT   Risk Assessment/Calculations:       Physical Exam:    VS:  BP 120/80 (BP Location: Left Arm, Patient Position: Sitting, Cuff Size: Normal)    Pulse 86    Ht _0  (1.753 m)    Wt 240 lb (108.9 kg)    SpO2 94%    BMI 35.44 kg/m     Wt Readings from Last 3 Encounters:  02/15/20 240 lb (108.9 kg)  09/21/16 239 lb (108.4 kg)  04/16/16 234 lb (106.1 kg)     GEN:  Well nourished, well developed in no acute distress, overweight HEENT: Normal NECK: No JVD; No carotid bruits LYMPHATICS: No lymphadenopathy CARDIAC: RRR, no murmurs, rubs, gallops RESPIRATORY:  Clear to auscultation without rales, wheezing or rhonchi  ABDOMEN: Soft, non-tender, non-distended MUSCULOSKELETAL:   No edema; No deformity  SKIN: Warm and dry NEUROLOGIC:  Alert and oriented x 3 PSYCHIATRIC:  Normal affect   ASSESSMENT:    1. Vasovagal reaction   2. Precordial pain   3. Dyspnea on exertion   4. Family history of early CAD   69. Pre-procedure lab exam    PLAN:    In order of problems listed above:  Near syncope -Sounds like a vasovagal type reaction.  He remembers EMS telling him the same thing.  Continue with hydration, salt liberalization especially during heavy exertional activity etc.  He is not on any antihypertensives.  If these continue, would have low threshold for monitoring.  Chest pressure/family history of CAD -Also has shortness of breath, father had MI at an early age.  We will go ahead and check a coronary CT scan with possible FFR analysis.  Obesity -Continue to encourage weight loss.  He lost 40 pounds on weight watchers previously.  He has hip pain.   Medication Adjustments/Labs and Tests Ordered: Current medicines are reviewed at length with the patient today.  Concerns regarding medicines are outlined above.  Orders Placed This Encounter  Procedures   CT CORONARY MORPH W/CTA COR W/SCORE W/CA W/CM &/OR WO/CM   CT CORONARY FRACTIONAL FLOW RESERVE DATA PREP   CT CORONARY FRACTIONAL FLOW RESERVE FLUID ANALYSIS   Basic metabolic panel   EKG 19-ERDE   Meds ordered this encounter  Medications   metoprolol tartrate (LOPRESSOR) 100 MG tablet    Sig: Take 1 tablet (100 mg total) by mouth once for 1 dose.    Dispense:  1 tablet    Refill:  0    Patient Instructions  Medication Instructions:  The current medical regimen is effective;  continue present plan and medications.  *If you need a refill on your cardiac medications before your next appointment,  please call your pharmacy*  Lab Work: You will need blood work before your CT scan.  This will be scheduled once the CT has been scheduled.  If you have labs (blood work) drawn today and your tests  are completely normal, you will receive your results only by:  [place] (if you have MyChart) OR  A paper copy in the mail If you have any lab test that is abnormal or we need to change your treatment, we will call you to review the results.   Testing/Procedures: Your cardiac CT will be scheduled at:   [facility] [address] [place], [place] [address] [phone]  Please arrive at the [facility] main entrance of [facility] 30 minutes prior to test start time. Proceed to the [facility] Radiology Department (first floor) to check-in and test prep.  Please follow these instructions carefully (unless otherwise directed):  Hold all erectile dysfunction medications at least 3 days (72 hrs) prior to test.  On the Night Before the Test:  Be sure to Drink plenty of water.  Do not consume any caffeinated/decaffeinated beverages or chocolate 12 hours prior to your test.  Do not take any antihistamines 12 hours prior to your test.  On the Day of the Test:  Drink plenty of water. Do not drink any water within one hour of the test.  Do not eat any food 4 hours prior to the test.  You may take your regular medications prior to the test.   Take metoprolol (Lopressor) two hours prior to test.  HOLD Furosemide/Hydrochlorothiazide morning of the test.   After the Test:  Drink plenty of water.  After receiving IV contrast, you may experience a mild flushed feeling. This is normal.  On occasion, you may experience a mild rash up to 24 hours after the test. This is not dangerous. If this occurs, you can take Benadryl 25 mg and increase your fluid intake.  If you experience trouble breathing, this can be serious. If it is severe call 911 IMMEDIATELY. If it is mild, please call our office.  If you take any of these medications: Glipizide/Metformin, Avandament, Glucavance, please do not take 48 hours after completing test unless otherwise  instructed.  Once we have confirmed authorization from your insurance company, we will call you to set up a date and time for your test. Based on how quickly your insurance processes prior authorizations requests, please allow up to 4 weeks to be contacted for scheduling your Cardiac CT appointment. Be advised that routine Cardiac CT appointments could be scheduled as many as 8 weeks after your provider has ordered it.  For non-scheduling related questions, please contact the cardiac imaging nurse navigator should you have any questions/concerns: Marchia Bond, Cardiac Imaging Nurse Navigator Burley Saver, Interim Cardiac Imaging Nurse [place] and Vascular Services Direct Office Dial: [phone]   For scheduling needs, including cancellations and rescheduling, please call [place], [phone].  Follow-Up: At [facility], you and your health needs are our priority.  As part of our continuing mission to provide you with exceptional heart care, we have created designated Provider Care Teams.  These Care Teams include your primary Cardiologist (physician) and Advanced Practice Providers (APPs -  Physician Assistants and Nurse Practitioners) who all work together to provide you with the care you need, when you need it.  We recommend signing up for the patient portal called "MyChart".  Sign up information is provided on this After Visit Summary.  MyChart is used to connect with patients for Virtual Visits (Telemedicine).  Patients are able to view lab/test results, encounter notes, upcoming appointments, etc.  Non-urgent messages can be sent to your provider as well.   To learn more about what you can do with MyChart, go to [email or web address].    Your next appointment:   12 month(s)  The format for your next appointment:   In Person  Provider:   Candee Furbish, MD   Thank you for choosing [facility]!!        Signed, Candee Furbish, MD  02/15/2020 5:34 PM     [place]

## 2020-02-15 NOTE — Patient Instructions (Signed)
Medication Instructions:  The current medical regimen is effective;  continue present plan and medications.  *If you need a refill on your cardiac medications before your next appointment, please call your pharmacy*  Lab Work: You will need blood work before your CT scan.  This will be scheduled once the CT has been scheduled.  If you have labs (blood work) drawn today and your tests are completely normal, you will receive your results only by: Marland Kitchen MyChart Message (if you have MyChart) OR . A paper copy in the mail If you have any lab test that is abnormal or we need to change your treatment, we will call you to review the results.   Testing/Procedures: Your cardiac CT will be scheduled at:   [facility] [address] [place], [place] [address] [phone]  Please arrive at the [facility] main entrance of [facility] 30 minutes prior to test start time. Proceed to the [facility] Department (first floor) to check-in and test prep.  Please follow these instructions carefully (unless otherwise directed):  Hold all erectile dysfunction medications at least 3 days (72 hrs) prior to test.  On the Night Before the Test: . Be sure to Drink plenty of water. . Do not consume any caffeinated/decaffeinated beverages or chocolate 12 hours prior to your test. . Do not take any antihistamines 12 hours prior to your test.  On the Day of the Test: . Drink plenty of water. Do not drink any water within one hour of the test. . Do not eat any food 4 hours prior to the test. . You may take your regular medications prior to the test.  . Take metoprolol (Lopressor) two hours prior to test. . HOLD Furosemide/Hydrochlorothiazide morning of the test. .  After the Test: . Drink plenty of water. . After receiving IV contrast, you may experience a mild flushed feeling. This is normal. . On occasion, you may experience a mild rash up to 24 hours after the test. This is  not dangerous. If this occurs, you can take Benadryl 25 mg and increase your fluid intake. . If you experience trouble breathing, this can be serious. If it is severe call 911 IMMEDIATELY. If it is mild, please call our office. . If you take any of these medications: Glipizide/Metformin, Avandament, Glucavance, please do not take 48 hours after completing test unless otherwise instructed.  Once we have confirmed authorization from your insurance company, we will call you to set up a date and time for your test. Based on how quickly your insurance processes prior authorizations requests, please allow up to 4 weeks to be contacted for scheduling your Cardiac CT appointment. Be advised that routine Cardiac CT appointments could be scheduled as many as 8 weeks after your provider has ordered it.  For non-scheduling related questions, please contact the cardiac imaging nurse navigator should you have any questions/concerns: Marchia Bond, Cardiac Imaging Nurse Navigator Burley Saver, Interim Cardiac Imaging Nurse [place] [facility] Direct Office Dial: [phone]   For scheduling needs, including cancellations and rescheduling, please call [place], [phone].  Follow-Up: At [facility], you and your health needs are our priority.  As part of our continuing mission to provide you with exceptional heart care, we have created designated Provider Care Teams.  These Care Teams include your primary Cardiologist (physician) and Advanced Practice Providers (APPs -  Physician Assistants and Nurse Practitioners) who all work together to provide you with the care  you need, when you need it.  We recommend signing up for the patient portal called "MyChart".  Sign up information is provided on this After Visit Summary.  MyChart is used to connect with patients for Virtual Visits (Telemedicine).  Patients are able to view lab/test results, encounter notes, upcoming appointments, etc.   Non-urgent messages can be sent to your provider as well.   To learn more about what you can do with MyChart, go to [email or web address].    Your next appointment:   12 month(s)  The format for your next appointment:   In Person  Provider:   Candee Furbish, MD   Thank you for choosing [facility]!!

## 2020-03-05 DIAGNOSIS — J309 Allergic rhinitis, unspecified: Secondary | ICD-10-CM | POA: Diagnosis not present

## 2020-03-05 DIAGNOSIS — R0982 Postnasal drip: Secondary | ICD-10-CM | POA: Diagnosis not present

## 2020-03-05 DIAGNOSIS — R062 Wheezing: Secondary | ICD-10-CM | POA: Diagnosis not present

## 2020-03-10 DIAGNOSIS — J45901 Unspecified asthma with (acute) exacerbation: Secondary | ICD-10-CM | POA: Diagnosis not present

## 2020-03-19 DIAGNOSIS — R062 Wheezing: Secondary | ICD-10-CM | POA: Diagnosis not present

## 2020-03-20 DIAGNOSIS — Z01818 Encounter for other preprocedural examination: Secondary | ICD-10-CM | POA: Diagnosis not present

## 2020-03-20 DIAGNOSIS — R1314 Dysphagia, pharyngoesophageal phase: Secondary | ICD-10-CM | POA: Diagnosis not present

## 2020-03-20 DIAGNOSIS — K219 Gastro-esophageal reflux disease without esophagitis: Secondary | ICD-10-CM | POA: Diagnosis not present

## 2020-03-20 DIAGNOSIS — K649 Unspecified hemorrhoids: Secondary | ICD-10-CM | POA: Diagnosis not present

## 2020-04-03 ENCOUNTER — Telehealth (HOSPITAL_COMMUNITY): Payer: Self-pay | Admitting: Emergency Medicine

## 2020-04-03 NOTE — Telephone Encounter (Signed)
Attempted to call patient regarding upcoming cardiac CT appointment. Left message on voicemail with name and callback number  Informed of impending inclement weather for thur night and Friday morning. Encouraged a call to let us know if he cannot make his appt.  Marchia Bond RN Navigator Cardiac Imaging [facility] [phone] Office [phone] Cell

## 2020-04-04 ENCOUNTER — Other Ambulatory Visit: Payer: BC Managed Care – PPO

## 2020-04-04 ENCOUNTER — Telehealth (HOSPITAL_COMMUNITY): Payer: Self-pay | Admitting: *Deleted

## 2020-04-04 ENCOUNTER — Other Ambulatory Visit: Payer: Self-pay

## 2020-04-04 DIAGNOSIS — Z01812 Encounter for preprocedural laboratory examination: Secondary | ICD-10-CM

## 2020-04-04 DIAGNOSIS — R072 Precordial pain: Secondary | ICD-10-CM

## 2020-04-04 NOTE — Telephone Encounter (Signed)
Pt returning call regarding upcoming cardiac imaging study; pt verbalizes understanding of appt date/time, parking situation and where to check in, pre-test NPO status and medications ordered, and verified current allergies; name and call back number provided for further questions should they arise  Ragen Laver RN Navigator Cardiac Imaging [place] [facility] [phone] office [phone] cell  

## 2020-04-05 ENCOUNTER — Other Ambulatory Visit: Payer: Self-pay

## 2020-04-05 ENCOUNTER — Ambulatory Visit (HOSPITAL_COMMUNITY)
Admission: RE | Admit: 2020-04-05 | Discharge: 2020-04-05 | Disposition: A | Discharge status: Home / Self Care | Payer: BC Managed Care – PPO | Source: Ambulatory Visit | Attending: Cardiology | Admitting: Cardiology

## 2020-04-05 DIAGNOSIS — Z8249 Family history of ischemic heart disease and other diseases of the circulatory system: Secondary | ICD-10-CM | POA: Insufficient documentation

## 2020-04-05 DIAGNOSIS — R0600 Dyspnea, unspecified: Secondary | ICD-10-CM | POA: Diagnosis not present

## 2020-04-05 DIAGNOSIS — R0609 Other forms of dyspnea: Secondary | ICD-10-CM

## 2020-04-05 DIAGNOSIS — R072 Precordial pain: Secondary | ICD-10-CM | POA: Insufficient documentation

## 2020-04-05 LAB — BASIC METABOLIC PANEL
BUN/Creatinine Ratio: 12 (ref 10–24)
BUN: 13 mg/dL (ref 8–27)
CO2: 19 mmol/L — ABNORMAL LOW (ref 20–29)
Calcium: 9.5 mg/dL (ref 8.6–10.2)
Chloride: 108 mmol/L — ABNORMAL HIGH (ref 96–106)
Creatinine, Ser: 1.05 mg/dL (ref 0.76–1.27)
GFR calc Af Amer: 87 mL/min/{1.73_m2} (ref >59)
GFR calc non Af Amer: 75 mL/min/{1.73_m2} (ref >59)
Glucose: 94 mg/dL (ref 65–99)
Potassium: 4.7 mmol/L (ref 3.5–5.2)
Sodium: 143 mmol/L (ref 134–144)

## 2020-04-05 MED ORDER — IOHEXOL 350 MG/ML SOLN
80.0000 mL | Freq: Once | INTRAVENOUS | Status: AC | PRN
  Administered 2020-04-05: 80 mL via INTRAVENOUS

## 2020-04-05 MED ORDER — NITROGLYCERIN 0.4 MG SL SUBL
0.8000 mg | SUBLINGUAL_TABLET | Freq: Once | SUBLINGUAL | Status: AC
  Administered 2020-04-05: 0.8 mg via SUBLINGUAL

## 2020-04-05 MED ORDER — NITROGLYCERIN 0.4 MG SL SUBL
SUBLINGUAL_TABLET | SUBLINGUAL | Status: AC
  Filled 2020-04-05: qty 2

## 2020-04-08 ENCOUNTER — Telehealth: Payer: Self-pay | Admitting: *Deleted

## 2020-04-08 DIAGNOSIS — E785 Hyperlipidemia, unspecified: Secondary | ICD-10-CM

## 2020-04-08 DIAGNOSIS — R072 Precordial pain: Secondary | ICD-10-CM

## 2020-04-08 NOTE — Telephone Encounter (Signed)
Coronary calcium score of 149. This was 95 percentile for age and  sex matched control.   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  Recommend goal directed medical therapy.   We will increase Crestor from 5mg  to 20mg  PO QD to help with plaque stabilization.  Repeat lipids and ALT in 3 months.  Also start ASA 81 mg if not already taking.   Candee Furbish, MD   Attempted to call pt to discuss the above information.  Left message at home # requesting he call back to discuss and schedule f/u lab work.

## 2020-04-09 MED ORDER — ROSUVASTATIN CALCIUM 20 MG PO TABS: 20.0000 mg | ORAL_TABLET | Freq: Every day | ORAL | 3 refills | Status: DC

## 2020-04-09 NOTE — Telephone Encounter (Signed)
The patient has been notified of the result and verbalized understanding.  All questions (if any) were answered. Antonieta Iba, RN 04/09/2020 10:04 AM  Patient will increase Crestor to 20 mg daily. Rx has been sent in. He will repeat lab work in 3 months. He is already taking ASA 81 mg daily which he will continue to do.

## 2020-04-09 NOTE — Telephone Encounter (Signed)
Follow up: ° ° ° ° °Patient returning a call back concering his results. °

## 2020-05-31 DIAGNOSIS — B3781 Candidal esophagitis: Secondary | ICD-10-CM | POA: Diagnosis not present

## 2020-05-31 DIAGNOSIS — R1310 Dysphagia, unspecified: Secondary | ICD-10-CM | POA: Diagnosis not present

## 2020-05-31 DIAGNOSIS — K3189 Other diseases of stomach and duodenum: Secondary | ICD-10-CM | POA: Diagnosis not present

## 2020-05-31 DIAGNOSIS — K2930 Chronic superficial gastritis without bleeding: Secondary | ICD-10-CM | POA: Diagnosis not present

## 2020-05-31 DIAGNOSIS — K222 Esophageal obstruction: Secondary | ICD-10-CM | POA: Diagnosis not present

## 2020-05-31 DIAGNOSIS — Z1211 Encounter for screening for malignant neoplasm of colon: Secondary | ICD-10-CM | POA: Diagnosis not present

## 2020-05-31 DIAGNOSIS — K229 Disease of esophagus, unspecified: Secondary | ICD-10-CM | POA: Diagnosis not present

## 2020-05-31 DIAGNOSIS — K449 Diaphragmatic hernia without obstruction or gangrene: Secondary | ICD-10-CM | POA: Diagnosis not present

## 2020-05-31 DIAGNOSIS — K648 Other hemorrhoids: Secondary | ICD-10-CM | POA: Diagnosis not present

## 2020-05-31 DIAGNOSIS — K621 Rectal polyp: Secondary | ICD-10-CM | POA: Diagnosis not present

## 2020-05-31 DIAGNOSIS — K5730 Diverticulosis of large intestine without perforation or abscess without bleeding: Secondary | ICD-10-CM | POA: Diagnosis not present

## 2020-07-08 ENCOUNTER — Other Ambulatory Visit: Payer: BC Managed Care – PPO | Admitting: *Deleted

## 2020-07-08 ENCOUNTER — Other Ambulatory Visit: Payer: Self-pay

## 2020-07-08 DIAGNOSIS — R072 Precordial pain: Secondary | ICD-10-CM | POA: Diagnosis not present

## 2020-07-08 DIAGNOSIS — E785 Hyperlipidemia, unspecified: Secondary | ICD-10-CM

## 2020-07-08 LAB — LIPID PANEL
Chol/HDL Ratio: 4.2 ratio (ref 0.0–5.0)
Cholesterol, Total: 158 mg/dL (ref 100–199)
HDL: 38 mg/dL — ABNORMAL LOW (ref >39)
LDL Chol Calc (NIH): 89 mg/dL (ref 0–99)
Triglycerides: 183 mg/dL — ABNORMAL HIGH (ref 0–149)
VLDL Cholesterol Cal: 31 mg/dL (ref 5–40)

## 2020-07-08 LAB — ALT: ALT: 40 IU/L (ref 0–44)

## 2020-07-17 ENCOUNTER — Telehealth: Payer: Self-pay | Admitting: Cardiology

## 2020-07-17 DIAGNOSIS — E785 Hyperlipidemia, unspecified: Secondary | ICD-10-CM

## 2020-07-17 DIAGNOSIS — Z79899 Other long term (current) drug therapy: Secondary | ICD-10-CM

## 2020-07-17 MED ORDER — EZETIMIBE 10 MG PO TABS: 10.0000 mg | ORAL_TABLET | Freq: Every day | ORAL | 3 refills | Status: DC

## 2020-07-17 NOTE — Telephone Encounter (Signed)
Pt is aware of addition medication added for better control of his LDL.  Repeat lab ordered and scheduled for 10/18/2020.  Pt was grateful for the call back and assistance.  He had no further questions at the time of the call.

## 2020-07-17 NOTE — Telephone Encounter (Signed)
LDL 83, goal is <70 with CAD  On Crestor 20mg   Let's add Zetia 10mg  PO QD and repeat in 3 months   Thanks  Candee Furbish, MD

## 2020-07-17 NOTE — Telephone Encounter (Signed)
He requested a call back on his cell phone at [phone].

## 2020-07-17 NOTE — Telephone Encounter (Signed)
Patient is returning call to discuss his lab results. 

## 2020-08-16 DIAGNOSIS — M25551 Pain in right hip: Secondary | ICD-10-CM | POA: Diagnosis not present

## 2020-09-02 ENCOUNTER — Other Ambulatory Visit: Payer: Self-pay | Admitting: Family Medicine

## 2020-09-02 ENCOUNTER — Ambulatory Visit
Admission: RE | Admit: 2020-09-02 | Discharge: 2020-09-02 | Disposition: A | Discharge status: Home / Self Care | Payer: BC Managed Care – PPO | Source: Ambulatory Visit | Attending: Family Medicine | Admitting: Family Medicine

## 2020-09-02 DIAGNOSIS — R062 Wheezing: Secondary | ICD-10-CM

## 2020-09-02 DIAGNOSIS — R9389 Abnormal findings on diagnostic imaging of other specified body structures: Secondary | ICD-10-CM

## 2020-09-02 DIAGNOSIS — R059 Cough, unspecified: Secondary | ICD-10-CM | POA: Diagnosis not present

## 2020-09-18 DIAGNOSIS — M25551 Pain in right hip: Secondary | ICD-10-CM | POA: Diagnosis not present

## 2020-10-18 ENCOUNTER — Other Ambulatory Visit: Payer: Self-pay

## 2020-10-18 ENCOUNTER — Other Ambulatory Visit: Payer: BC Managed Care – PPO | Admitting: *Deleted

## 2020-10-18 DIAGNOSIS — Z79899 Other long term (current) drug therapy: Secondary | ICD-10-CM | POA: Diagnosis not present

## 2020-10-18 DIAGNOSIS — E785 Hyperlipidemia, unspecified: Secondary | ICD-10-CM

## 2020-10-18 LAB — LIPID PANEL
Chol/HDL Ratio: 3.1 ratio (ref 0.0–5.0)
Cholesterol, Total: 115 mg/dL (ref 100–199)
HDL: 37 mg/dL — ABNORMAL LOW (ref >39)
LDL Chol Calc (NIH): 56 mg/dL (ref 0–99)
Triglycerides: 124 mg/dL (ref 0–149)
VLDL Cholesterol Cal: 22 mg/dL (ref 5–40)

## 2020-10-18 LAB — ALT: ALT: 44 IU/L (ref 0–44)

## 2020-10-21 DIAGNOSIS — H16223 Keratoconjunctivitis sicca, not specified as Sjogren's, bilateral: Secondary | ICD-10-CM | POA: Diagnosis not present

## 2020-10-21 DIAGNOSIS — H04123 Dry eye syndrome of bilateral lacrimal glands: Secondary | ICD-10-CM | POA: Diagnosis not present

## 2020-10-21 DIAGNOSIS — H25013 Cortical age-related cataract, bilateral: Secondary | ICD-10-CM | POA: Diagnosis not present

## 2020-10-21 DIAGNOSIS — H2513 Age-related nuclear cataract, bilateral: Secondary | ICD-10-CM | POA: Diagnosis not present

## 2020-11-04 DIAGNOSIS — R059 Cough, unspecified: Secondary | ICD-10-CM | POA: Diagnosis not present

## 2020-11-04 DIAGNOSIS — R5383 Other fatigue: Secondary | ICD-10-CM | POA: Diagnosis not present

## 2020-11-04 DIAGNOSIS — R509 Fever, unspecified: Secondary | ICD-10-CM | POA: Diagnosis not present

## 2020-11-04 DIAGNOSIS — R519 Headache, unspecified: Secondary | ICD-10-CM | POA: Diagnosis not present

## 2020-11-05 DIAGNOSIS — R509 Fever, unspecified: Secondary | ICD-10-CM | POA: Diagnosis not present

## 2020-11-05 DIAGNOSIS — R5383 Other fatigue: Secondary | ICD-10-CM | POA: Diagnosis not present

## 2020-11-05 DIAGNOSIS — Z03818 Encounter for observation for suspected exposure to other biological agents ruled out: Secondary | ICD-10-CM | POA: Diagnosis not present

## 2020-11-05 DIAGNOSIS — B349 Viral infection, unspecified: Secondary | ICD-10-CM | POA: Diagnosis not present

## 2020-11-05 DIAGNOSIS — R059 Cough, unspecified: Secondary | ICD-10-CM | POA: Diagnosis not present

## 2020-12-14 DIAGNOSIS — R053 Chronic cough: Secondary | ICD-10-CM | POA: Diagnosis not present

## 2021-01-20 DIAGNOSIS — Z125 Encounter for screening for malignant neoplasm of prostate: Secondary | ICD-10-CM | POA: Diagnosis not present

## 2021-01-20 DIAGNOSIS — Z0000 Encounter for general adult medical examination without abnormal findings: Secondary | ICD-10-CM | POA: Diagnosis not present

## 2021-01-20 DIAGNOSIS — R7303 Prediabetes: Secondary | ICD-10-CM | POA: Diagnosis not present

## 2021-01-20 DIAGNOSIS — E782 Mixed hyperlipidemia: Secondary | ICD-10-CM | POA: Diagnosis not present

## 2021-01-23 DIAGNOSIS — E1169 Type 2 diabetes mellitus with other specified complication: Secondary | ICD-10-CM | POA: Diagnosis not present

## 2021-01-23 DIAGNOSIS — K760 Fatty (change of) liver, not elsewhere classified: Secondary | ICD-10-CM | POA: Diagnosis not present

## 2021-01-23 DIAGNOSIS — Z0000 Encounter for general adult medical examination without abnormal findings: Secondary | ICD-10-CM | POA: Diagnosis not present

## 2021-01-23 DIAGNOSIS — R413 Other amnesia: Secondary | ICD-10-CM | POA: Diagnosis not present

## 2021-01-23 DIAGNOSIS — N401 Enlarged prostate with lower urinary tract symptoms: Secondary | ICD-10-CM | POA: Diagnosis not present

## 2021-02-04 NOTE — Progress Notes (Signed)
New Patient Note  RE: Kyle Baird MRN: [id number] DOB: [birth year] Date of Office Visit: 02/05/2021  Consult requested by: No ref. provider found Primary care provider: Lawerance Cruel, MD  Chief Complaint: Cough (Had the cough for a year - not its gone - z pack, singular, albuterol inhaler, prednisone 10 days 1 tablet a day. Some coughing in the morning, started in October of last year when we went to the middle [place] to [place] and had the same issue. ) and Allergic Rhinitis  (Some itchy eyes and sinus congestion when the weather changes )  History of Present Illness: I had the pleasure of seeing Kyle Baird for initial evaluation at the [facility] on 02/05/2021. He is a 65 y.o. male, who is self-referred here for the evaluation of coughing and allergic rhinitis.  In October 2021 patient went to the [place] for 2 weeks and when he came back. He developed a dry cough with some mucous in the morning.  He also had some wheezing, shortness of breath.   These symptoms lasted for 6-7 months.  He went to [place] in August 2022 and went to the walk in clinic due to the same symptoms as above. He was given a zpak, prednisone, Singulair and albuterol prn which helped.  He also raked his leaves.  His symptoms are pretty much resolved except for some coughing that is worse at night and in the mornings.   Patient has no prior history of asthma.   Current medications include albuterol 1 puff BID which help. He reports not using aerochamber with inhalers. He tried the following inhalers: some type of steroid inhaler. Main triggers are unknown. In the last month, frequency of symptoms: daily. Frequency of nocturnal symptoms: 0x/month. Frequency of SABA use: daily. Interference with physical activity: no. Sleep is undisturbed. In the last 12 months, emergency room visits/urgent care visits/doctor office visits or hospitalizations due to respiratory issues: once. In the  last 12 months, oral steroids courses: one. Lifetime history of hospitalization for respiratory issues: no. Prior intubations: no. History of pneumonia: no. He was not evaluated by allergist/pulmonologist in the past. Smoking exposure: no. Up to date with flu vaccine: yes. Up to date with pneumonia vaccine: no. Up to date with COVID-19 vaccine: yes - only J&J. Prior Covid-19 infection: no. History of reflux: patient had EGD and had a fungal infection in the throat in the past, denies any reflux symptoms.  He had an EGD and required dilatation in the summer 2022 as he had trouble swallowing [place] fries.  Denies any diagnosis of Eoe but is not sure of biopsy results.  09/02/2020 CXR: "IMPRESSION: No acute findings."  Assessment and Plan: Kyle Baird is a 65 y.o. male with: Reactive airway disease without complication Persistent coughing with episodes of wheezing and shortness of breath since October 2021. Recent flare after traveling to [place] requiring Z-Pak, prednisone.  Recently started on Singulair and albuterol as needed with good benefit.  No prior asthma diagnosis.  Concerned about allergic triggers. Today's skin testing showed: Positive to dust mites, grass, mold, and cockroach. Negative to common foods.  Today's spirometry showed some restriction with 3% improvement in FEV1 post bronchodilator treatment.  Clinically feeling unchanged. Daily controller medication(s): start Flovent 155mcg 2 puffs once a day with spacer and rinse mouth afterwards. Spacer given and demonstrated proper use with inhaler. Patient understood technique and all questions/concerned were addressed.  Continue Singulair (montelukast) 10mg  daily at night. During upper  respiratory infections/asthma flares:  Start Flovent 135mcg 2 puffs twice a day for 1-2 weeks until your breathing symptoms return to baseline.  Pretreat with albuterol 2 puffs. May use albuterol rescue inhaler 2 puffs every 4 to 6 hours as needed for shortness  of breath, chest tightness, coughing, and wheezing. May use albuterol rescue inhaler 2 puffs 5 to 15 minutes prior to strenuous physical activities. Monitor frequency of use.  Get spirometry at next visit.  Other allergic rhinitis Rhinitis symptoms may in the spring and fall.  Takes Clarinex daily with good benefit. Today's skin testing showed: Positive to dust mites, grass, mold, and cockroach. Start environmental control measures as below. Continue Clarinex daily and may take twice a day during allergy flares. Continue Singulair (montelukast) 10mg  daily at night.  Heartburn Had EGD this summer requiring dilatation. Complained of dysphagia. Denies any prior EoE diagnosis.  See handout for lifestyle and dietary modifications. Requesting GI records.   Chronic cough Persistent coughing for almost 1 year which took many months to resolve. Recently had another flare requiring prednisone and antibiotics. The most common causes of chronic cough include the following: upper airway cough syndrome (UACS) which is caused by variety of rhinitis conditions; asthma; gastroesophageal reflux disease (GERD); chronic bronchitis from cigarette smoking or other inhaled environmental irritants; non-asthmatic eosinophilic bronchitis; and bronchiectasis.  In prospective studies, these conditions have accounted for up to 94% of the causes of chronic cough in immunocompetent adults.  The history and physical examination suggest that his cough is multifactorial with contribution from possible reactive airway disease, allergic rhinitis and GERD. See assessment and plan as above.  Return in about 2 months (around 04/07/2021).  Meds ordered this encounter  Medications   montelukast (SINGULAIR) 10 MG tablet    Sig: Take 1 tablet (10 mg total) by mouth at bedtime.    Dispense:  30 tablet    Refill:  5   desloratadine (CLARINEX) 5 MG tablet    Sig: Take 1 tablet (5 mg total) by mouth daily.    Dispense:  30 tablet     Refill:  5   fluticasone (FLOVENT HFA) 110 MCG/ACT inhaler    Sig: Inhale 2 puffs into the lungs daily. with spacer and rinse mouth afterwards.    Dispense:  1 each    Refill:  3    Lab Orders  No laboratory test(s) ordered today   Other allergy screening: Rhino conjunctivitis: yes Sinus pressure in the spring and fall. Takes Clarinex daily with good benefit. Other antihistamines cause some prostate issues.  Tried Flonase but stopped once he started all his other medications.   Food allergy: no Medication allergy: yes Hymenoptera allergy:  One episode of throat tightness after being stung by multiple times. He had been stung since then with no reactions like the above.  Urticaria: no Eczema:no History of recurrent infections suggestive of immunodeficency: no  Diagnostics: Spirometry:  Tracings reviewed. His effort: Good reproducible efforts. FVC: 3.28L FEV1: 2.58L, 74% predicted FEV1/FVC ratio: 79% Interpretation: Spirometry consistent with possible restrictive disease with 3% improvement in FEV1 post bronchodilator treatment. Clinically feeling unchanged.   Please see scanned spirometry results for details.  Skin Testing: Environmental allergy panel and select foods. Positive to dust mites, grass, mold, and cockroach. Negative to common foods.  Results discussed with patient/family.  Airborne Adult Perc - 02/05/21 0942     Time Antigen Placed 4270    Allergen Manufacturer Lavella Hammock    Location Back    Number of Test  59    1. Control-Buffer 50% Glycerol Negative    2. Control-Histamine 1 mg/ml 2+    3. Albumin saline Negative    4. Robinson Negative    5. [place] Negative    6. Johnson Negative    7. [place] Blue Negative    8. Meadow Fescue Negative    9. Perennial Rye Negative    10. Sweet Vernal Negative    11. Timothy Negative    12. Cocklebur Negative    13. Burweed Marshelder Negative    14. Ragweed, short Negative    15. Ragweed, Giant Negative    16.  Plantain,  English Negative    17. Lamb's Quarters Negative    18. Sheep Sorrell Negative    19. Rough Pigweed Negative    20. Marsh Elder, Rough Negative    21. Mugwort, Common Negative    22. Ash mix Negative    23. Birch mix Negative    24. Beech American Negative    25. Box, Elder Negative    26. Cedar, red Negative    27. Cottonwood, [place] Negative    28. Elm mix Negative    29. Hickory Negative    30. Maple mix Negative    31. Oak, [place] mix Negative    32. Pecan Pollen Negative    33. Pine mix Negative    34. Sycamore Eastern Negative    35. Three Lakes, Black Pollen Negative    36. Alternaria alternata Negative    37. Cladosporium Herbarum Negative    38. Aspergillus mix Negative    39. Penicillium mix Negative    40. Bipolaris sorokiniana (Helminthosporium) Negative    41. Drechslera spicifera (Curvularia) Negative    42. Mucor plumbeus Negative    43. Fusarium moniliforme Negative    44. Aureobasidium pullulans (pullulara) Negative    45. Rhizopus oryzae Negative    46. Botrytis cinera Negative    47. Epicoccum nigrum Negative    48. Phoma betae Negative    49. Candida Albicans Negative    50. Trichophyton mentagrophytes Negative    51. Mite, D Farinae  5,000 AU/ml 2+    52. Mite, D Pteronyssinus  5,000 AU/ml 2+    53. Cat Hair 10,000 BAU/ml Negative    54.  Dog Epithelia Negative    55. Mixed Feathers Negative    56. Horse Epithelia Negative    57. Cockroach, German Negative    58. Mouse Negative    59. Tobacco Leaf Negative             Food Perc - 02/05/21 0942       Test Information   Time Antigen Placed 0923    Allergen Manufacturer Lavella Hammock    Location Back    Number of allergen test 10      Food   1. Peanut Negative    2. Soybean food Negative    3. Wheat, whole Negative    4. Sesame Negative    5. Milk, cow Negative    6. Egg White, chicken Negative    7. Casein Negative    8. Shellfish mix Negative    9. Fish mix Negative    10.  Cashew Negative             Intradermal - 02/05/21 0957     Time Antigen Placed 1000    Allergen Manufacturer Lavella Hammock    Location Back    Number of Test 14    Control Negative  [place] Negative    Johnson 2+    7 Grass Negative    Ragweed mix Negative    Weed mix Negative    Tree mix Negative    Mold 1 2+    Mold 2 2+    Mold 3 2+    Mold 4 2+    Cat Negative    Dog Negative    Cockroach 2+             Past Medical History: Patient Active Problem List   Diagnosis Date Noted   Reactive airway disease without complication 39/76/7341   Other allergic rhinitis 02/05/2021   Heartburn 02/05/2021   Chronic cough 02/05/2021   Exertional dyspnea 04/15/2015   Obesity 04/15/2015   Type 2 diabetes mellitus without complication, without long-term current use of insulin ([place]) 04/15/2015   Hyperlipidemia 04/15/2015   Past Medical History:  Diagnosis Date   Adrenal adenoma, right    Chronically dry eyes, bilateral    Diverticulosis    per pt   Fatty liver    per pt liver bx 11-05-2006   Hyperlipidemia    Left ureteral stone    Pre-diabetes    Renal calculus, left    Wears glasses    Past Surgical History: Past Surgical History:  Procedure Laterality Date   CARDIOVASCULAR STRESS TEST  04-22-2015  dr Marlou Porch   Intermediate nuclear study w/ no ischemia and medium defect with moderate severity (consistent with artifact)/  normal wall motion,  stress ef 47%   COLONOSCOPY  2009   CYSTOSCOPY WITH RETROGRADE PYELOGRAM, URETEROSCOPY AND STENT PLACEMENT Left 09/21/2016   Procedure: CYSTOSCOPY WITH RETROGRADE PYELOGRAM, URETEROSCOPY, STONE EXTRACTION  AND STENT PLACEMENT;  Surgeon: Franchot Gallo, MD;  Location: [facility];  Service: Urology;  Laterality: Left;   EXCISION SUBCUTANEOUS MASS, POSTERIOR SCALP  11-18-2001  dr Excell Seltzer   lipoma   HOLMIUM LASER APPLICATION Left 11/16/7900   Procedure: HOLMIUM LASER APPLICATION;  Surgeon: Franchot Gallo, MD;   Location: [facility];  Service: Urology;  Laterality: Left;   INGUINAL HERNIA REPAIR Bilateral 1997   TONSILLECTOMY  1664   TRANSTHORACIC ECHOCARDIOGRAM  06/03/2015  dr Marlou Porch   grade 1 diastolic dysfunction, ef 40-97%/  trivial MR and TR/  atrial septum aneurysm/     Medication List:  Current Outpatient Medications  Medication Sig Dispense Refill   albuterol (VENTOLIN HFA) 108 (90 Base) MCG/ACT inhaler Inhale 2 puffs into the lungs every 6 (six) hours as needed.     aspirin EC 81 MG tablet Take 81 mg by mouth daily.     Cholecalciferol (VITAMIN D PO) Take 1 tablet by mouth daily.     desloratadine (CLARINEX) 5 MG tablet Take 1 tablet (5 mg total) by mouth daily. 30 tablet 5   empagliflozin (JARDIANCE) 25 MG TABS tablet 10 mg.     ezetimibe (ZETIA) 10 MG tablet Take 1 tablet (10 mg total) by mouth daily. 90 tablet 3   fluticasone (FLOVENT HFA) 110 MCG/ACT inhaler Inhale 2 puffs into the lungs daily. with spacer and rinse mouth afterwards. 1 each 3   ibuprofen (ADVIL,MOTRIN) 200 MG tablet Take 600 mg by mouth every 6 (six) hours as needed.     montelukast (SINGULAIR) 10 MG tablet Take 1 tablet (10 mg total) by mouth at bedtime. 30 tablet 5   Multiple Vitamin (MULTIVITAMIN) tablet Take 1 tablet by mouth daily.     Omega-3 Fatty Acids (FISH OIL PO) Take 2 tablets by mouth  daily.      oxybutynin (DITROPAN) 5 MG tablet Take 1 tablet (5 mg total) by mouth 3 (three) times daily. 15 tablet 1   rosuvastatin (CRESTOR) 20 MG tablet Take 1 tablet (20 mg total) by mouth daily. 90 tablet 3   Sodium Chloride, Hypertonic, (SOCHLOR OP) Apply to eye as needed.     tamsulosin (FLOMAX) 0.4 MG CAPS capsule Take 1 capsule (0.4 mg total) by mouth daily after supper. 30 capsule 0   vitamin E 400 UNIT capsule Take 400 Units by mouth daily.     fluticasone (FLONASE) 50 MCG/ACT nasal spray 1 SPRAY IN EACH NOSTRIL ONCE A DAY NASALLY 90 DAYS--  AM (Patient not taking: Reported on 02/05/2021)  4    metoprolol tartrate (LOPRESSOR) 100 MG tablet Take 1 tablet (100 mg total) by mouth once for 1 dose. 1 tablet 0   XIIDRA 5 % SOLN PLACE 1 DROP INTO BOTH EYES TWICE A DAY FOR DRY EYES (Patient not taking: Reported on 02/05/2021)  2   No current facility-administered medications for this visit.   Allergies: Allergies  Allergen Reactions   Penicillins Hives    Has patient had a PCN reaction causing immediate rash, facial/tongue/throat swelling, SOB or lightheadedness with hypotension: no Has patient had a PCN reaction causing severe rash involving mucus membranes or skin necrosis: yes Has patient had a PCN reaction that required hospitalization: no Has patient had a PCN reaction occurring within the last 10 years: no If all of the above answers are "NO", then may proceed with Cephalosporin use.    Trimox [Amoxicillin] Hives   Social History: Social History   Socioeconomic History   Marital status: Married    Spouse name: Not on file   Number of children: Not on file   Years of education: Not on file   Highest education level: Not on file  Occupational History   Not on file  Tobacco Use   Smoking status: Never   Smokeless tobacco: Former    Types: Snuff    Quit date: 09/12/1983  Vaping Use   Vaping Use: Never used  Substance and Sexual Activity   Alcohol use: Yes    Comment: RARE   Drug use: No   Sexual activity: Not on file  Other Topics Concern   Not on file  Social History Narrative   Not on file   Social Determinants of Health   Financial Resource Strain: Not on file  Food Insecurity: Not on file  Transportation Needs: Not on file  Physical Activity: Not on file  Stress: Not on file  Social Connections: Not on file   Lives in a 64 year old house. Smoking: denies Occupation: Doctor, general practice HistoryFreight forwarder in the house: no Charity fundraiser in the family room: no Carpet in the bedroom: yes Heating: electric Cooling: central Pet:  no  Family History: Family History  Problem Relation Age of Onset   Diabetes Mellitus II Mother    Cirrhosis Mother    Liver disease Mother    Heart attack Father 85       CABG    Diabetes Mellitus II Father    Liver disease Father    Healthy Sister    Pulmonary embolism Brother    Healthy Daughter    Healthy Son    Problem  Relation Asthma                                   Son, daughter  Review of Systems  Constitutional:  Negative for appetite change, chills, fever and unexpected weight change.  HENT:  Negative for congestion and rhinorrhea.   Eyes:  Negative for itching.  Respiratory:  Positive for cough. Negative for chest tightness, shortness of breath and wheezing.   Cardiovascular:  Negative for chest pain.  Gastrointestinal:  Negative for abdominal pain.  Genitourinary:  Negative for difficulty urinating.  Skin:  Negative for rash.  Allergic/Immunologic: Positive for environmental allergies. Negative for food allergies.  Neurological:  Negative for headaches.   Objective: BP 120/74   Pulse 75   Temp 98 F (36.7 C) (Temporal)   Resp 20   Ht 5\' 9"  (1.753 m)   Wt 237 lb 6.4 oz (107.7 kg)   SpO2 95%   BMI 35.06 kg/m  Body mass index is 35.06 kg/m. Physical Exam Vitals and nursing note reviewed.  Constitutional:      Appearance: Normal appearance. He is well-developed.  HENT:     Head: Normocephalic and atraumatic.     Right Ear: Tympanic membrane and external ear normal.     Left Ear: Tympanic membrane and external ear normal.     Nose: Nose normal.     Mouth/Throat:     Mouth: Mucous membranes are moist.     Pharynx: Oropharynx is clear.  Eyes:     Conjunctiva/sclera: Conjunctivae normal.  Cardiovascular:     Rate and Rhythm: Normal rate and regular rhythm.     Heart sounds: Normal heart sounds. No murmur heard.   No friction rub. No gallop.  Pulmonary:     Effort: Pulmonary effort is normal.     Breath sounds: Normal  breath sounds. No wheezing, rhonchi or rales.  Musculoskeletal:     Cervical back: Neck supple.  Skin:    General: Skin is warm.     Findings: No rash.  Neurological:     Mental Status: He is alert and oriented to person, place, and time.  Psychiatric:        Behavior: Behavior normal.  The plan was reviewed with the patient/family, and all questions/concerned were addressed.  It was my pleasure to see Zayden today and participate in his care. Please feel free to contact me with any questions or concerns.  Sincerely,  Rexene Alberts, DO Allergy & Immunology  [facility] office: [place] office: [phone]

## 2021-02-05 ENCOUNTER — Other Ambulatory Visit: Payer: Self-pay

## 2021-02-05 ENCOUNTER — Encounter: Payer: Self-pay | Admitting: Allergy

## 2021-02-05 ENCOUNTER — Ambulatory Visit (INDEPENDENT_AMBULATORY_CARE_PROVIDER_SITE_OTHER): Payer: BC Managed Care – PPO | Admitting: Allergy

## 2021-02-05 VITALS — BP 120/74 | HR 75 | Temp 98.0°F | Resp 20 | Ht 69.0 in | Wt 237.4 lb

## 2021-02-05 DIAGNOSIS — J3089 Other allergic rhinitis: Secondary | ICD-10-CM

## 2021-02-05 DIAGNOSIS — J4541 Moderate persistent asthma with (acute) exacerbation: Secondary | ICD-10-CM

## 2021-02-05 DIAGNOSIS — R12 Heartburn: Secondary | ICD-10-CM

## 2021-02-05 DIAGNOSIS — R053 Chronic cough: Secondary | ICD-10-CM | POA: Diagnosis not present

## 2021-02-05 DIAGNOSIS — J302 Other seasonal allergic rhinitis: Secondary | ICD-10-CM | POA: Insufficient documentation

## 2021-02-05 DIAGNOSIS — J45909 Unspecified asthma, uncomplicated: Secondary | ICD-10-CM

## 2021-02-05 MED ORDER — DESLORATADINE 5 MG PO TABS: 5.0000 mg | ORAL_TABLET | Freq: Every day | ORAL | 5 refills | Status: DC

## 2021-02-05 MED ORDER — FLUTICASONE PROPIONATE HFA 110 MCG/ACT IN AERO: 2.0000 | INHALATION_SPRAY | Freq: Every day | RESPIRATORY_TRACT | 3 refills | Status: DC

## 2021-02-05 MED ORDER — MONTELUKAST SODIUM 10 MG PO TABS: 10.0000 mg | ORAL_TABLET | Freq: Every day | ORAL | 5 refills | Status: DC

## 2021-02-05 NOTE — Assessment & Plan Note (Addendum)
Persistent coughing for almost 1 year which took many months to resolve. Recently had another flare requiring prednisone and antibiotics. The most common causes of chronic cough include the following: upper airway cough syndrome (UACS) which is caused by variety of rhinitis conditions; asthma; gastroesophageal reflux disease (GERD); chronic bronchitis from cigarette smoking or other inhaled environmental irritants; non-asthmatic eosinophilic bronchitis; and bronchiectasis.  In prospective studies, these conditions have accounted for up to 94% of the causes of chronic cough in immunocompetent adults.  . The history and physical examination suggest that his cough is multifactorial with contribution from possible reactive airway disease, allergic rhinitis and GERD. Kyle Baird See assessment and plan as above.

## 2021-02-05 NOTE — Assessment & Plan Note (Signed)
Had EGD this summer requiring dilatation. Complained of dysphagia. Denies any prior EoE diagnosis.   See handout for lifestyle and dietary modifications.  Requesting GI records.

## 2021-02-05 NOTE — Assessment & Plan Note (Signed)
Rhinitis symptoms may in the spring and fall.  Takes Clarinex daily with good benefit.  Today's skin testing showed: Positive to dust mites, grass, mold, and cockroach.  Start environmental control measures as below.  Continue Clarinex daily and may take twice a day during allergy flares.  Continue Singulair (montelukast) 10mg  daily at night.

## 2021-02-05 NOTE — Assessment & Plan Note (Signed)
Persistent coughing with episodes of wheezing and shortness of breath since October 2021. Recent flare after traveling to [place] requiring Z-Pak, prednisone.  Recently started on Singulair and albuterol as needed with good benefit.  No prior asthma diagnosis.  Concerned about allergic triggers.  Today's skin testing showed: Positive to dust mites, grass, mold, and cockroach. Negative to common foods.   Today's spirometry showed some restriction with 3% improvement in FEV1 post bronchodilator treatment.  Clinically feeling unchanged. . Daily controller medication(s): start Flovent 124mcg 2 puffs once a day with spacer and rinse mouth afterwards. Marland Kitchen Spacer given and demonstrated proper use with inhaler. Patient understood technique and all questions/concerned were addressed.  . Continue Singulair (montelukast) 10mg  daily at night. . During upper respiratory infections/asthma flares:  o Start Flovent 118mcg 2 puffs twice a day for 1-2 weeks until your breathing symptoms return to baseline.  o Pretreat with albuterol 2 puffs. . May use albuterol rescue inhaler 2 puffs every 4 to 6 hours as needed for shortness of breath, chest tightness, coughing, and wheezing. May use albuterol rescue inhaler 2 puffs 5 to 15 minutes prior to strenuous physical activities. Monitor frequency of use.  . Get spirometry at next visit.

## 2021-02-05 NOTE — Patient Instructions (Addendum)
Today's skin testing showed: Positive to dust mites, grass, mold, and cockroach. Negative to common foods.   Coughing: Daily controller medication(s): start Flovent 14mcg 2 puffs once a day with spacer and rinse mouth afterwards. Spacer given and demonstrated proper use with inhaler. Patient understood technique and all questions/concerned were addressed.  Continue Singulair (montelukast) 10mg  daily at night. During upper respiratory infections/asthma flares:  Start Flovent 175mcg 2 puffs twice a day for 1-2 weeks until your breathing symptoms return to baseline.  Pretreat with albuterol 2 puffs. May use albuterol rescue inhaler 2 puffs every 4 to 6 hours as needed for shortness of breath, chest tightness, coughing, and wheezing. May use albuterol rescue inhaler 2 puffs 5 to 15 minutes prior to strenuous physical activities. Monitor frequency of use.  Breathing control goals:  Full participation in all desired activities (may need albuterol before activity) Albuterol use two times or less a week on average (not counting use with activity) Cough interfering with sleep two times or less a month Oral steroids no more than once a year No hospitalizations   Environmental allergies Start environmental control measures as below. Continue Clarinex daily and may take twice a day during allergy flares. Continue Singulair (montelukast) 10mg  daily at night.  Heartburn: See handout for lifestyle and dietary modifications.  Follow up in 2 months or sooner if needed.    Requesting records from GI  Control of House Dust Mite Allergen Dust mite allergens are a common trigger of allergy and asthma symptoms. While they can be found throughout the house, these microscopic creatures thrive in warm, humid environments such as bedding, upholstered furniture and carpeting. Because so much time is spent in the bedroom, it is essential to reduce mite levels there.  Encase pillows, mattresses, and box  springs in special allergen-proof fabric covers or airtight, zippered plastic covers.  Bedding should be washed weekly in hot water (130 F) and dried in a hot dryer. Allergen-proof covers are available for comforters and pillows that can't be regularly washed.  Wash the allergy-proof covers every few months. Minimize clutter in the bedroom. Keep pets out of the bedroom.  Keep humidity less than 50% by using a dehumidifier or air conditioning. You can buy a humidity measuring device called a hygrometer to monitor this.  If possible, replace carpets with hardwood, linoleum, or washable area rugs. If that's not possible, vacuum frequently with a vacuum that has a HEPA filter. Remove all upholstered furniture and non-washable window drapes from the bedroom. Remove all non-washable stuffed toys from the bedroom.  Wash stuffed toys weekly.  Mold Control Mold and fungi can grow on a variety of surfaces provided certain temperature and moisture conditions exist.  Outdoor molds grow on plants, decaying vegetation and soil. The major outdoor mold, Alternaria and Cladosporium, are found in very high numbers during hot and dry conditions. Generally, a late summer - fall peak is seen for common outdoor fungal spores. Rain will temporarily lower outdoor mold spore count, but counts rise rapidly when the rainy period ends. The most important indoor molds are Aspergillus and Penicillium. Dark, humid and poorly ventilated basements are ideal sites for mold growth. The next most common sites of mold growth are the bathroom and the kitchen. Outdoor (Seasonal) Mold Control Use air conditioning and keep windows closed. Avoid exposure to decaying vegetation. Avoid leaf raking. Avoid grain handling. Consider wearing a face mask if working in moldy areas.  Indoor (Perennial) Mold Control  Maintain humidity below 50%. Get rid of  mold growth on hard surfaces with water, detergent and, if necessary, 5% bleach (do not mix  with other cleaners). Then dry the area completely. If mold covers an area more than 10 square feet, consider hiring an indoor environmental professional. For clothing, washing with soap and water is best. If moldy items cannot be cleaned and dried, throw them away. Remove sources e.g. contaminated carpets. Repair and seal leaking roofs or pipes. Using dehumidifiers in damp basements may be helpful, but empty the water and clean units regularly to prevent mildew from forming. All rooms, especially basements, bathrooms and kitchens, require ventilation and cleaning to deter mold and mildew growth. Avoid carpeting on concrete or damp floors, and storing items in damp areas. Reducing Pollen Exposure Pollen seasons: trees (spring), grass (summer) and ragweed/weeds (fall). Keep windows closed in your home and car to lower pollen exposure.  Install air conditioning in the bedroom and throughout the house if possible.  Avoid going out in dry windy days - especially early morning. Pollen counts are highest between 5 - 10 AM and on dry, hot and windy days.  Save outside activities for late afternoon or after a heavy rain, when pollen levels are lower.  Avoid mowing of grass if you have grass pollen allergy. Be aware that pollen can also be transported indoors on people and pets.  Dry your clothes in an automatic dryer rather than hanging them outside where they might collect pollen.  Rinse hair and eyes before bedtime. Cockroach Allergen Avoidance Cockroaches are often found in the homes of densely populated urban areas, schools or commercial buildings, but these creatures can lurk almost anywhere. This does not mean that you have a dirty house or living area. Block all areas where roaches can enter the home. This includes crevices, wall cracks and windows.  Cockroaches need water to survive, so fix and seal all leaky faucets and pipes. Have an exterminator go through the house when your family and pets are  gone to eliminate any remaining roaches. Keep food in lidded containers and put pet food dishes away after your pets are done eating. Vacuum and sweep the floor after meals, and take out garbage and recyclables. Use lidded garbage containers in the kitchen. Wash dishes immediately after use and clean under stoves, refrigerators or toasters where crumbs can accumulate. Wipe off the stove and other kitchen surfaces and cupboards regularly.

## 2021-02-19 DIAGNOSIS — L821 Other seborrheic keratosis: Secondary | ICD-10-CM | POA: Diagnosis not present

## 2021-02-19 DIAGNOSIS — L814 Other melanin hyperpigmentation: Secondary | ICD-10-CM | POA: Diagnosis not present

## 2021-02-19 DIAGNOSIS — D1801 Hemangioma of skin and subcutaneous tissue: Secondary | ICD-10-CM | POA: Diagnosis not present

## 2021-02-28 ENCOUNTER — Encounter: Payer: Self-pay | Admitting: Allergy

## 2021-02-28 IMAGING — CT CT HEART MORP W/ CTA COR W/ SCORE W/ CA W/CM &/OR W/O CM
1 series · 6 of 10 positions shown, 8 images · non-contrast
Comparison: None.
COMPARISON: None.

Addendum:
EXAM:
OVER-READ INTERPRETATION  CT CHEST

The following report is an over-read performed by radiologist Dr.
Jozi Wale Atlang [REDACTED] on 04/05/2020. This
over-read does not include interpretation of cardiac or coronary
anatomy or pathology. The coronary calcium score/coronary CTA
interpretation by the cardiologist is attached.
CLINICAL DATA: 64 year old with chest pain and early family history
of MI.
Cardiac/Coronary  CTA
TECHNIQUE: The patient was scanned on a Phillips Force scanner.

[Series 817: — · 0.27mm/px · 6 of 10 slices shown, 8 images]
[im 3/10  vessel]
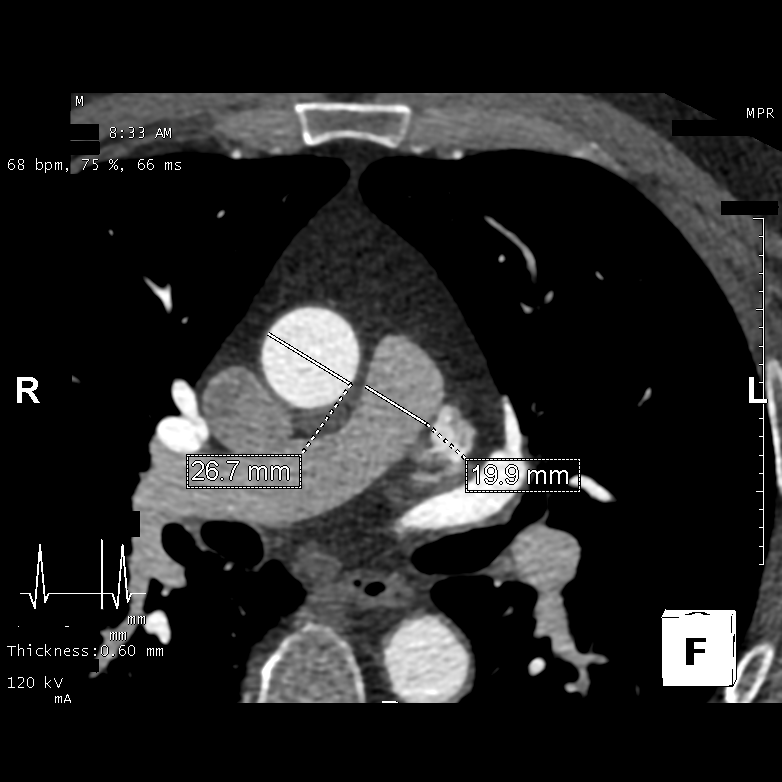
[im 3/10  lung]
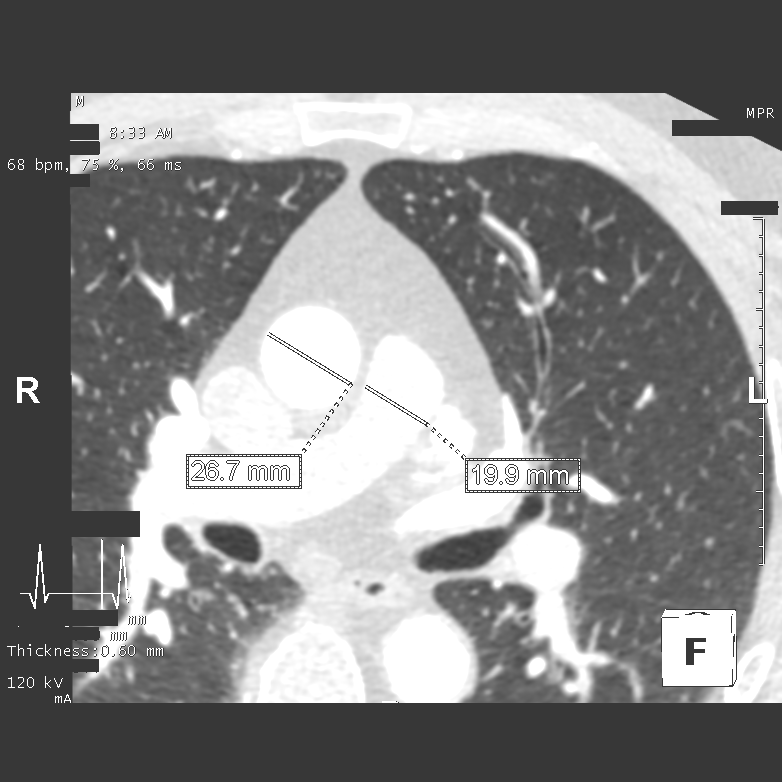
[im 4/10  vessel]
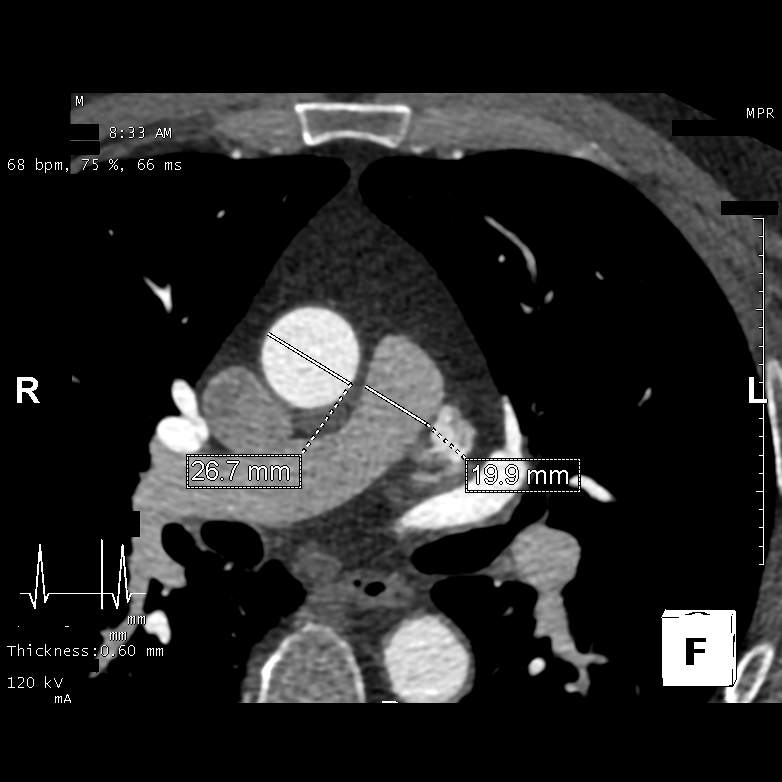
[im 5/10  vessel]
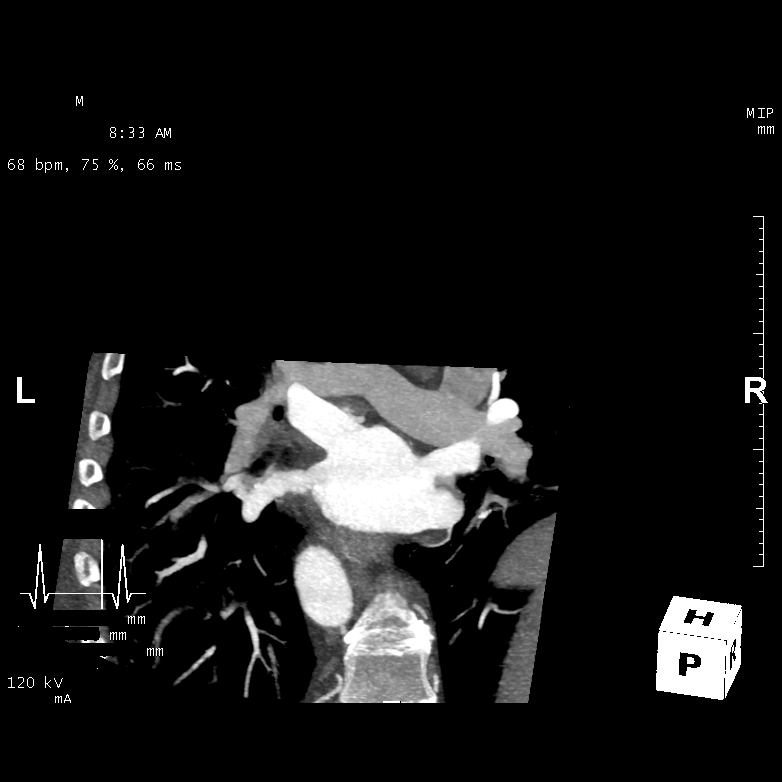
[im 7/10  vessel]
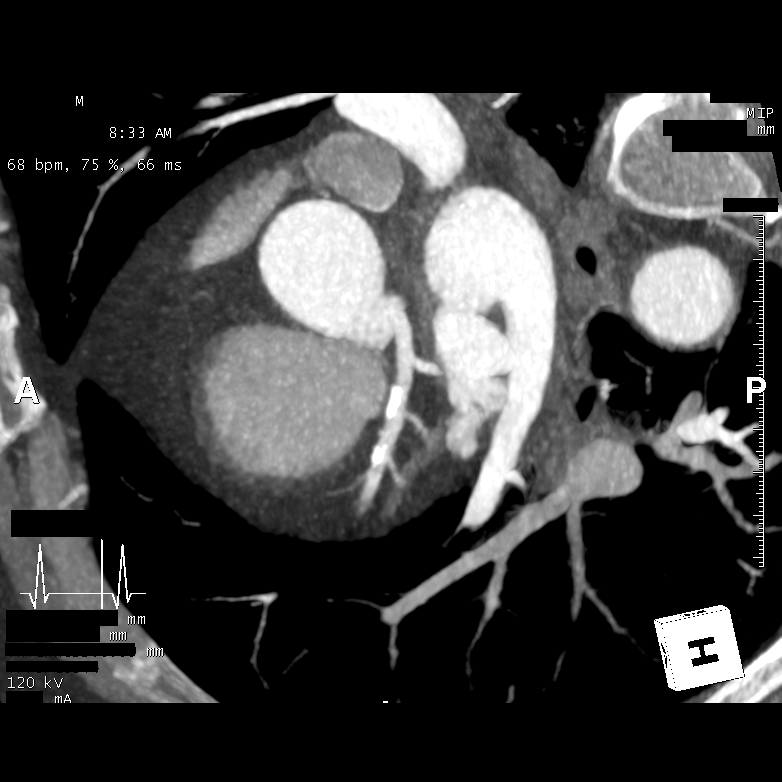
[im 8/10  vessel]
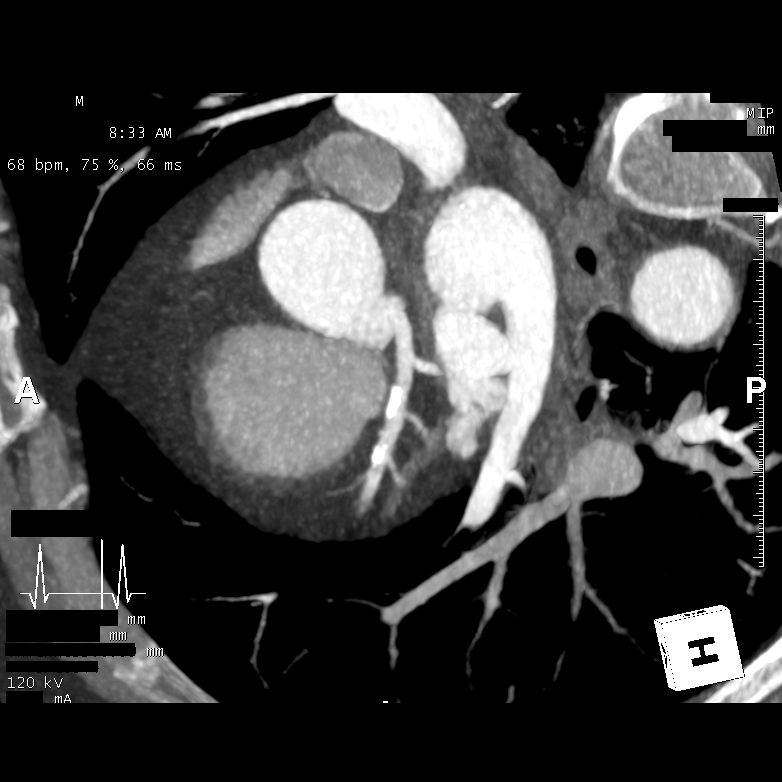
[im 8/10  lung]
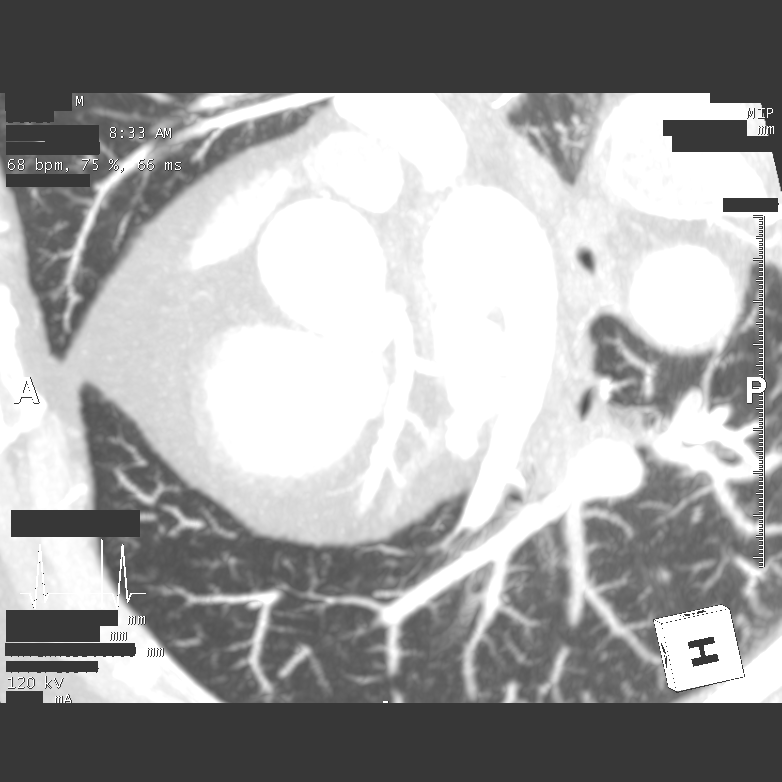
[im 9/10  vessel]
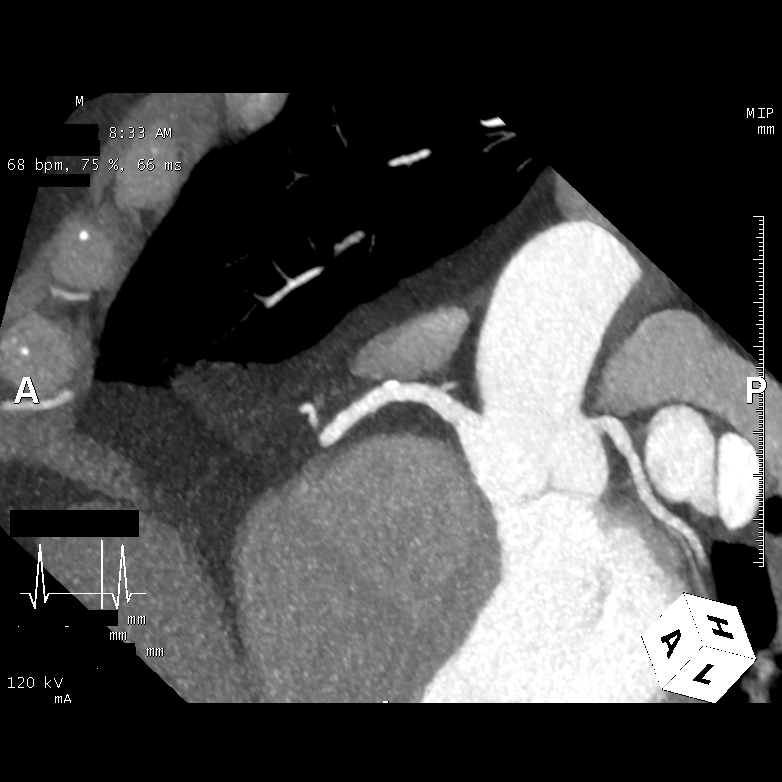

[6 of 10 positions shown; findings below may reference images not displayed]

FINDINGS: Within the visualized portions of the thorax there are no suspicious
appearing pulmonary nodules or masses, there is no acute
consolidative airspace disease, no pleural effusions, no
pneumothorax and no lymphadenopathy. Visualized portions of the
upper abdomen are unremarkable. There are no aggressive appearing
lytic or blastic lesions noted in the visualized portions of the
skeleton.
IMPRESSION: 1. No significant incidental noncardiac findings are noted.
FINDINGS: A 120 kV prospective scan was triggered in the descending thoracic
aorta at 111 HU's. Axial non-contrast 3 mm slices were carried out
through the heart. The data set was analyzed on a dedicated work
station and scored using the Agatson method. Gantry rotation speed
was 250 msecs and collimation was .6 mm. Beta blockade and 0.8 mg of
sl NTG was given. The 3D data set was reconstructed in 5% intervals
of the 67-82 % of the R-R cycle. Diastolic phases were analyzed on a
dedicated work station using MPR, MIP and VRT modes. The patient
received 80 cc of contrast.

Aorta:  Normal size.  No calcifications.  No dissection.

Aortic Valve:  Trileaflet.  No calcifications.

Coronary Arteries:  Normal coronary origin.  Right dominance.

RCA is a very large dominant artery that gives rise to PDA and PLA.
There is mild calcified proximal plaque, 0-24%. Stair step artifact
noted but interpretable vessel.

Left main is a large artery that gives rise to LAD and LCX arteries.

LAD is a large vessel that has two calcified lesions, 0-24% at the
bifurcation of small first diagonal branch and small 2rd diagonal
branch, 25-49%. There is a small segment of distal LAD branch that
is not visualized due to stair step artifact.

LCX is a non-dominant artery that gives rise to one moderate sized
OM1 branch. There is no plaque. There is a small segment of non
visualization in the mid OM secondary to stair step artifact.

Other findings:

Normal pulmonary vein drainage into the left atrium.

Normal left atrial appendage without a thrombus.

Normal size of the pulmonary artery.

Please see radiology report for non cardiac findings.
IMPRESSION: 1. Coronary calcium score of 149. This was 67 percentile for age and
sex matched control.

2. Normal coronary origin with right dominance.

3. There are non obstructive calcified plaques in the LAD and RCA.
Recommend goal directed medical therapy.

*** End of Addendum ***
EXAM:
OVER-READ INTERPRETATION  CT CHEST

The following report is an over-read performed by radiologist Dr.
Jozi Wale Atlang [REDACTED] on 04/05/2020. This
over-read does not include interpretation of cardiac or coronary
anatomy or pathology. The coronary calcium score/coronary CTA
interpretation by the cardiologist is attached.
FINDINGS: Within the visualized portions of the thorax there are no suspicious
appearing pulmonary nodules or masses, there is no acute
consolidative airspace disease, no pleural effusions, no
pneumothorax and no lymphadenopathy. Visualized portions of the
upper abdomen are unremarkable. There are no aggressive appearing
lytic or blastic lesions noted in the visualized portions of the
skeleton.
IMPRESSION: 1. No significant incidental noncardiac findings are noted.

## 2021-02-28 NOTE — Progress Notes (Signed)
Reviewed notes from  GI Dr. Ronnette Juniper . Date of service: 05/31/2020. See scanned notes for full documentation. EGD Biopsy was negative for eoe, H phylori.  Had candida esophagitis.

## 2021-03-29 ENCOUNTER — Other Ambulatory Visit: Payer: Self-pay | Admitting: Cardiology

## 2021-04-04 ENCOUNTER — Encounter: Payer: Self-pay | Admitting: Cardiology

## 2021-04-04 ENCOUNTER — Ambulatory Visit (INDEPENDENT_AMBULATORY_CARE_PROVIDER_SITE_OTHER): Payer: BC Managed Care – PPO | Admitting: Cardiology

## 2021-04-04 ENCOUNTER — Other Ambulatory Visit: Payer: Self-pay

## 2021-04-04 VITALS — BP 120/70 | HR 75 | Ht 69.0 in | Wt 226.0 lb

## 2021-04-04 DIAGNOSIS — J45909 Unspecified asthma, uncomplicated: Secondary | ICD-10-CM

## 2021-04-04 DIAGNOSIS — E785 Hyperlipidemia, unspecified: Secondary | ICD-10-CM

## 2021-04-04 DIAGNOSIS — Z8249 Family history of ischemic heart disease and other diseases of the circulatory system: Secondary | ICD-10-CM | POA: Diagnosis not present

## 2021-04-04 DIAGNOSIS — I2510 Atherosclerotic heart disease of native coronary artery without angina pectoris: Secondary | ICD-10-CM

## 2021-04-04 DIAGNOSIS — E669 Obesity, unspecified: Secondary | ICD-10-CM

## 2021-04-04 NOTE — Assessment & Plan Note (Signed)
Nonobstructive plaque noted on coronary CT.  Continue with goal-directed medical therapy.  Aspirin 81 mg, Crestor 20 mg, Zetia 10 mg.  He is now at goal with LDL of 56.  Excellent.

## 2021-04-04 NOTE — Assessment & Plan Note (Signed)
Takes Flovent now.  Doing very well.  He states that he stopped his montelukast, was given him some urinary retention he states.  He will discuss with his allergist.

## 2021-04-04 NOTE — Assessment & Plan Note (Signed)
Excellent job with weight loss.  Another 14 pounds.

## 2021-04-04 NOTE — Progress Notes (Signed)
Cardiology Office Note:    Date:  04/04/2021   ID:  Kyle Baird, DOB September 18, 1955, MRN [id number]  PCP:  Lawerance Cruel, MD   [facility] Providers Cardiologist:  Candee Furbish, MD     Referring MD: Lawerance Cruel, MD    History of Present Illness:    Kyle Baird is a 66 y.o. male here for the follow-up of coronary artery disease seen on coronary CT scan.  Hyperlipidemia, Crestor was increased and Zetia was added.  Has a strong family history of MI father dying at age 38 with heart attack at age 76 and CABG.  Many years ago in 2017 I saw him.  He is an Art gallery manager at [employer].   He had an episode of syncope/presyncope twice  J&J Covid vaccine, felt tired afterwards.  Was laying on the couch.  He got up from the nap and started to feel sweaty, pale, as though he needed to go to the bathroom, felt as though he was going to faint.  After a few minutes, this resolved.  EMS arrived and he was back to normal.  His blood pressure was low he states.     He had another episode of near syncope in September at a restaurant while eating.  Felt sweaty once again slightly pale but did not faint.     Some of these episodes may have surrounded heavy exertional activity in the yard for instance, could have been dehydrated his wife thinks.     [name] went over felt fine. Hydrated well then.   2019 weight watchers - 40 pound weight loss.  Unfortunately he put this weight back on during Covid.   Viagra once 6 months. HA.  Be careful with low blood pressure.  He is working remotely 3 days a week.  Doing well.  Taking walks in the neighborhood without any difficulty.  No chest pain no fevers chills nausea vomiting.  His reactive airways is much better on Flovent.    Past Medical History:  Diagnosis Date   Adrenal adenoma, right    Chronically dry eyes, bilateral    Diverticulosis    per pt   Fatty liver    per pt liver bx 11-05-2006   Hyperlipidemia    Left  ureteral stone    Pre-diabetes    Renal calculus, left    Wears glasses     Past Surgical History:  Procedure Laterality Date   CARDIOVASCULAR STRESS TEST  04-22-2015  dr Marlou Porch   Intermediate nuclear study w/ no ischemia and medium defect with moderate severity (consistent with artifact)/  normal wall motion,  stress ef 47%   COLONOSCOPY  2009   CYSTOSCOPY WITH RETROGRADE PYELOGRAM, URETEROSCOPY AND STENT PLACEMENT Left 09/21/2016   Procedure: CYSTOSCOPY WITH RETROGRADE PYELOGRAM, URETEROSCOPY, STONE EXTRACTION  AND STENT PLACEMENT;  Surgeon: Franchot Gallo, MD;  Location: [facility];  Service: Urology;  Laterality: Left;   EXCISION SUBCUTANEOUS MASS, POSTERIOR SCALP  11-18-2001  dr Excell Seltzer   lipoma   HOLMIUM LASER APPLICATION Left 07/17/2990   Procedure: HOLMIUM LASER APPLICATION;  Surgeon: Franchot Gallo, MD;  Location: [facility];  Service: Urology;  Laterality: Left;   INGUINAL HERNIA REPAIR Bilateral 1997   TONSILLECTOMY  1664   TRANSTHORACIC ECHOCARDIOGRAM  06/03/2015  dr Marlou Porch   grade 1 diastolic dysfunction, ef 42-68%/  trivial MR and TR/  atrial septum aneurysm/      Current Medications: Current Meds  Medication Sig  albuterol (VENTOLIN HFA) 108 (90 Base) MCG/ACT inhaler Inhale 2 puffs into the lungs every 6 (six) hours as needed.   aspirin EC 81 MG tablet Take 81 mg by mouth daily.   Cholecalciferol (VITAMIN D PO) Take 1 tablet by mouth daily.   desloratadine (CLARINEX) 5 MG tablet Take 1 tablet (5 mg total) by mouth daily.   empagliflozin (JARDIANCE) 25 MG TABS tablet 10 mg.   ezetimibe (ZETIA) 10 MG tablet Take 1 tablet (10 mg total) by mouth daily.   fluticasone (FLOVENT HFA) 110 MCG/ACT inhaler Inhale 2 puffs into the lungs daily. with spacer and rinse mouth afterwards.   ibuprofen (ADVIL,MOTRIN) 200 MG tablet Take 600 mg by mouth every 6 (six) hours as needed.   montelukast (SINGULAIR) 10 MG tablet Take 1 tablet (10 mg total) by  mouth at bedtime.   Multiple Vitamin (MULTIVITAMIN) tablet Take 1 tablet by mouth daily.   Omega-3 Fatty Acids (FISH OIL PO) Take 2 tablets by mouth daily.    rosuvastatin (CRESTOR) 20 MG tablet TAKE 1 TABLET BY MOUTH EVERY DAY   Sodium Chloride, Hypertonic, (SOCHLOR OP) Apply to eye as needed.   tamsulosin (FLOMAX) 0.4 MG CAPS capsule Take 1 capsule (0.4 mg total) by mouth daily after supper. (Patient taking differently: Take 0.8 mg by mouth daily after supper.)   vitamin E 400 UNIT capsule Take 400 Units by mouth daily.     Allergies:   Penicillins and Trimox [amoxicillin]   Social History   Socioeconomic History   Marital status: Married    Spouse name: Not on file   Number of children: Not on file   Years of education: Not on file   Highest education level: Not on file  Occupational History   Not on file  Tobacco Use   Smoking status: Never   Smokeless tobacco: Former    Types: Snuff    Quit date: 09/12/1983  Vaping Use   Vaping Use: Never used  Substance and Sexual Activity   Alcohol use: Yes    Comment: RARE   Drug use: No   Sexual activity: Not on file  Other Topics Concern   Not on file  Social History Narrative   Not on file   Social Determinants of Health   Financial Resource Strain: Not on file  Food Insecurity: Not on file  Transportation Needs: Not on file  Physical Activity: Not on file  Stress: Not on file  Social Connections: Not on file     Family History: The patient's family history includes Cirrhosis in his mother; Diabetes Mellitus II in his father and mother; Healthy in his daughter, sister, and son; Heart attack (age of onset: 41) in his father; Liver disease in his father and mother; Pulmonary embolism in his brother.  ROS:   Please see the history of present illness.    No fevers chills nausea vomiting syncope bleeding all other systems reviewed and are negative.  EKGs/Labs/Other Studies Reviewed:    The following studies were reviewed  today:  2022: Coronary calcium score of 149. This was 28 percentile for age and  sex matched control.   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  Recommend goal directed medical therapy.        EKG:  EKG is  ordered today.  The ekg ordered today demonstrates sinus rhythm 75 bpm no ischemic changes  Recent Labs: 10/18/2020: ALT 44  Recent Lipid Panel    Component Value Date/Time  CHOL 115 10/18/2020 0816   TRIG 124 10/18/2020 0816   HDL 37 (L) 10/18/2020 0816   CHOLHDL 3.1 10/18/2020 0816   LDLCALC 56 10/18/2020 0816     Risk Assessment/Calculations:              Physical Exam:    VS:  BP 120/70 (BP Location: Left Arm, Patient Position: Sitting, Cuff Size: Normal)    Pulse 75    Ht 5\' 9"  (1.753 m)    Wt 226 lb (102.5 kg)    SpO2 95%    BMI 33.37 kg/m     Wt Readings from Last 3 Encounters:  04/04/21 226 lb (102.5 kg)  02/05/21 237 lb 6.4 oz (107.7 kg)  02/15/20 240 lb (108.9 kg)     GEN:  Well nourished, well developed in no acute distress HEENT: Normal NECK: No JVD; No carotid bruits LYMPHATICS: No lymphadenopathy CARDIAC: RRR, no murmurs, no rubs, gallops RESPIRATORY:  Clear to auscultation without rales, wheezing or rhonchi  ABDOMEN: Soft, non-tender, non-distended MUSCULOSKELETAL:  No edema; No deformity  SKIN: Warm and dry NEUROLOGIC:  Alert and oriented x 3 PSYCHIATRIC:  Normal affect   ASSESSMENT:    1. Hyperlipidemia, unspecified hyperlipidemia type   2. Family history of early CAD   3. Coronary artery disease involving native coronary artery of native heart without angina pectoris   4. Reactive airway disease without complication, unspecified asthma severity, unspecified whether persistent   5. Class 1 obesity in adult, unspecified BMI, unspecified obesity type, unspecified whether serious comorbidity present    PLAN:    In order of problems listed above:  Coronary artery disease involving native coronary artery of  native heart without angina pectoris Nonobstructive plaque noted on coronary CT.  Continue with goal-directed medical therapy.  Aspirin 81 mg, Crestor 20 mg, Zetia 10 mg.  He is now at goal with LDL of 56.  Excellent.  Hyperlipidemia Medications as above.  At goal.  LDL 56.  Wonderful.  Reactive airway disease without complication Takes Flovent now.  Doing very well.  He states that he stopped his montelukast, was given him some urinary retention he states.  He will discuss with his allergist.  Obesity Excellent job with weight loss.  Another 14 pounds.       Medication Adjustments/Labs and Tests Ordered: Current medicines are reviewed at length with the patient today.  Concerns regarding medicines are outlined above.  Orders Placed This Encounter  Procedures   EKG 12-Lead   No orders of the defined types were placed in this encounter.   Patient Instructions  Medication Instructions:  The current medical regimen is effective;  continue present plan and medications.  *If you need a refill on your cardiac medications before your next appointment, please call your pharmacy*  Follow-Up: At [facility], you and your health needs are our priority.  As part of our continuing mission to provide you with exceptional heart care, we have created designated Provider Care Teams.  These Care Teams include your primary Cardiologist (physician) and Advanced Practice Providers (APPs -  Physician Assistants and Nurse Practitioners) who all work together to provide you with the care you need, when you need it.  We recommend signing up for the patient portal called "MyChart".  Sign up information is provided on this After Visit Summary.  MyChart is used to connect with patients for Virtual Visits (Telemedicine).  Patients are able to view lab/test results, encounter notes, upcoming appointments, etc.  Non-urgent messages can be  sent to your provider as well.   To learn more about what you can do  with MyChart, go to [email or web address].    Your next appointment:   1 year(s)  The format for your next appointment:   In Person  Provider:   Candee Furbish, MD    Thank you for choosing [facility]!!      Signed, Candee Furbish, MD  04/04/2021 12:19 PM    [place]

## 2021-04-04 NOTE — Patient Instructions (Signed)
Medication Instructions:  The current medical regimen is effective;  continue present plan and medications.  *If you need a refill on your cardiac medications before your next appointment, please call your pharmacy*  Follow-Up: At [facility], you and your health needs are our priority.  As part of our continuing mission to provide you with exceptional heart care, we have created designated Provider Care Teams.  These Care Teams include your primary Cardiologist (physician) and Advanced Practice Providers (APPs -  Physician Assistants and Nurse Practitioners) who all work together to provide you with the care you need, when you need it.  We recommend signing up for the patient portal called "MyChart".  Sign up information is provided on this After Visit Summary.  MyChart is used to connect with patients for Virtual Visits (Telemedicine).  Patients are able to view lab/test results, encounter notes, upcoming appointments, etc.  Non-urgent messages can be sent to your provider as well.   To learn more about what you can do with MyChart, go to [email or web address].    Your next appointment:   1 year(s)  The format for your next appointment:   In Person  Provider:   Mark Skains, MD   Thank you for choosing [facility]!!    

## 2021-04-04 NOTE — Assessment & Plan Note (Signed)
Medications as above.  At goal.  LDL 56.  Wonderful.

## 2021-04-16 ENCOUNTER — Ambulatory Visit (INDEPENDENT_AMBULATORY_CARE_PROVIDER_SITE_OTHER): Payer: BC Managed Care – PPO | Admitting: Allergy

## 2021-04-16 ENCOUNTER — Encounter: Payer: Self-pay | Admitting: Allergy

## 2021-04-16 ENCOUNTER — Other Ambulatory Visit: Payer: Self-pay

## 2021-04-16 VITALS — BP 120/78 | HR 82 | Temp 98.0°F | Resp 18

## 2021-04-16 DIAGNOSIS — R12 Heartburn: Secondary | ICD-10-CM

## 2021-04-16 DIAGNOSIS — R499 Unspecified voice and resonance disorder: Secondary | ICD-10-CM | POA: Insufficient documentation

## 2021-04-16 DIAGNOSIS — J302 Other seasonal allergic rhinitis: Secondary | ICD-10-CM

## 2021-04-16 DIAGNOSIS — J4530 Mild persistent asthma, uncomplicated: Secondary | ICD-10-CM

## 2021-04-16 DIAGNOSIS — J3089 Other allergic rhinitis: Secondary | ICD-10-CM

## 2021-04-16 MED ORDER — MONTELUKAST SODIUM 10 MG PO TABS: 10.0000 mg | ORAL_TABLET | Freq: Every day | ORAL | 5 refills | Status: DC

## 2021-04-16 NOTE — Assessment & Plan Note (Signed)
Noted change in voice at the end of the day.  Recommend ENT evaluation to look at vocal cords.

## 2021-04-16 NOTE — Assessment & Plan Note (Signed)
Past history - Rhinitis symptoms may in the spring and fall.  Takes Clarinex daily with good benefit. 2022 skin testing showed: Positive to dust mites, grass, mold, and cockroach. Interim history - nasal congestion if not taking Clarinex however antihistamines affect his prostate.  Continue environmental control measures as below.  Stop Clarinex daily.  Monitor urine flow and nasal symptoms.  Recommend urology evaluation if still having issues after stopping antihistamines.   Restart Singulair (montelukast) 10mg  daily.

## 2021-04-16 NOTE — Assessment & Plan Note (Signed)
Past history - Had EGD this summer requiring dilatation. Complained of dysphagia. Reviewed GI notes - had candida esophagitis. Interim history - asymptomatic.   Continue lifestyle and dietary modifications. 

## 2021-04-16 NOTE — Progress Notes (Signed)
Follow Up Note  RE: Kyle Baird MRN: [id number] DOB: [birth year] Date of Office Visit: 04/16/2021  Referring provider: Lawerance Cruel, MD Primary care provider: Lawerance Cruel, MD  Chief Complaint: Asthma  History of Present Illness: I had the pleasure of seeing Kyle Baird for a follow up visit at the [facility] [place] on 04/16/2021. He is a 66 y.o. male, who is being followed for reactive airway disease, allergic rhinitis, heartburn and chronic cough. His previous allergy office visit was on 02/05/2021 with Dr. [name]. Today is a regular follow up visit.  Reactive airway disease Currently on Flovent 135mcg 2 puffs once a day. Stopped Singulair a few weeks ago as he thought it was worsening his prostate issues but didn't notice any improvement in his urination. He also did not notice any worsening breathing/rhinitis symptoms.   Denies any SOB, coughing, wheezing, chest tightness, nocturnal awakenings, ER/urgent care visits or prednisone use since the last visit.  Allergic rhinitis Usually has nasal congestion if he stops Clarinex.   Heartburn Doing much better with no issues. Not taking any medications for this.   Chronic cough Resolved.    Assessment and Plan: Kyle Baird is a 66 y.o. male with: Reactive airway disease without complication Past history - Persistent coughing with episodes of wheezing and shortness of breath since October 2021. Recent flare after traveling to [place] requiring Z-Pak, prednisone.  Recently started on Singulair and albuterol as needed with good benefit.  No prior asthma diagnosis.  2022 spirometry showed some restriction with 3% improvement in FEV1 post bronchodilator treatment.  Clinically feeling unchanged. Interim history - coughing resolved and breathing is better with Flovent. Stopped Singulair with no worsening symptoms. Today's spirometry was normal.  Daily controller medication(s): DECREASE Flovent 185mcg to ONE puff once a  day with spacer and rinse mouth afterwards. If you notice worsening symptoms then go back up to 2 puffs once a day. Restart Singulair (montelukast) 10mg  daily. During upper respiratory infections/breathing flares:  Start Flovent 171mcg 2 puffs twice a day for 1-2 weeks until your breathing symptoms return to baseline.  Pretreat with albuterol 2 puffs. May use albuterol rescue inhaler 2 puffs every 4 to 6 hours as needed for shortness of breath, chest tightness, coughing, and wheezing. May use albuterol rescue inhaler 2 puffs 5 to 15 minutes prior to strenuous physical activities. Monitor frequency of use.  Get spirometry at next visit. Consider stopping Flovent at next visit if doing well.   Seasonal and perennial allergic rhinitis Past history - Rhinitis symptoms may in the spring and fall.  Takes Clarinex daily with good benefit. 2022 skin testing showed: Positive to dust mites, grass, mold, and cockroach. Interim history - nasal congestion if not taking Clarinex however antihistamines affect his prostate. Continue environmental control measures as below. Stop Clarinex daily. Monitor urine flow and nasal symptoms. Recommend urology evaluation if still having issues after stopping antihistamines.  Restart Singulair (montelukast) 10mg  daily.   Heartburn Past history - Had EGD this summer requiring dilatation. Complained of dysphagia. Reviewed GI notes - had candida esophagitis. Interim history - asymptomatic.  Continue lifestyle and dietary modifications.  Change of voice Noted change in voice at the end of the day. Recommend ENT evaluation to look at vocal cords.   Return in about 4 months (around 08/14/2021).  Meds ordered this encounter  Medications   montelukast (SINGULAIR) 10 MG tablet    Sig: Take 1 tablet (10 mg total) by mouth daily.  Dispense:  30 tablet    Refill:  5   Lab Orders  No laboratory test(s) ordered today    Diagnostics: Spirometry:  Tracings reviewed.  His effort: Good reproducible efforts. FVC: 3.55L FEV1: 2.83L, 85% predicted FEV1/FVC ratio: 80% Interpretation: Spirometry consistent with normal pattern.  Improved from previous one. Please see scanned spirometry results for details.  Medication List:  Current Outpatient Medications  Medication Sig Dispense Refill   albuterol (VENTOLIN HFA) 108 (90 Base) MCG/ACT inhaler Inhale 2 puffs into the lungs every 6 (six) hours as needed.     aspirin EC 81 MG tablet Take 81 mg by mouth daily.     Cholecalciferol (VITAMIN D PO) Take 1 tablet by mouth daily.     empagliflozin (JARDIANCE) 25 MG TABS tablet 10 mg.     ezetimibe (ZETIA) 10 MG tablet Take 1 tablet (10 mg total) by mouth daily. 90 tablet 3   fluticasone (FLOVENT HFA) 110 MCG/ACT inhaler Inhale 2 puffs into the lungs daily. with spacer and rinse mouth afterwards. 1 each 3   ibuprofen (ADVIL,MOTRIN) 200 MG tablet Take 600 mg by mouth every 6 (six) hours as needed.     montelukast (SINGULAIR) 10 MG tablet Take 1 tablet (10 mg total) by mouth daily. 30 tablet 5   Multiple Vitamin (MULTIVITAMIN) tablet Take 1 tablet by mouth daily.     Omega-3 Fatty Acids (FISH OIL PO) Take 2 tablets by mouth daily.      rosuvastatin (CRESTOR) 20 MG tablet TAKE 1 TABLET BY MOUTH EVERY DAY 90 tablet 0   Sodium Chloride, Hypertonic, (SOCHLOR OP) Apply to eye as needed.     tamsulosin (FLOMAX) 0.4 MG CAPS capsule Take 1 capsule (0.4 mg total) by mouth daily after supper. (Patient taking differently: Take 0.8 mg by mouth daily after supper.) 30 capsule 0   vitamin E 400 UNIT capsule Take 400 Units by mouth daily.     No current facility-administered medications for this visit.   Allergies: Allergies  Allergen Reactions   Penicillins Hives    Has patient had a PCN reaction causing immediate rash, facial/tongue/throat swelling, SOB or lightheadedness with hypotension: no Has patient had a PCN reaction causing severe rash involving mucus membranes or skin  necrosis: yes Has patient had a PCN reaction that required hospitalization: no Has patient had a PCN reaction occurring within the last 10 years: no If all of the above answers are "NO", then may proceed with Cephalosporin use.    Trimox [Amoxicillin] Hives   I reviewed his past medical history, social history, family history, and environmental history and no significant changes have been reported from his previous visit.  Review of Systems  Constitutional:  Negative for appetite change, chills, fever and unexpected weight change.  HENT:  Positive for voice change. Negative for congestion and rhinorrhea.   Eyes:  Negative for itching.  Respiratory:  Negative for cough, chest tightness, shortness of breath and wheezing.   Cardiovascular:  Negative for chest pain.  Gastrointestinal:  Negative for abdominal pain.  Skin:  Negative for rash.  Allergic/Immunologic: Positive for environmental allergies. Negative for food allergies.  Neurological:  Negative for headaches.   Objective: BP 120/78    Pulse 82    Temp 98 F (36.7 C) (Temporal)    Resp 18    SpO2 96%  There is no height or weight on file to calculate BMI. Physical Exam Vitals and nursing note reviewed.  Constitutional:      Appearance: Normal appearance.  He is well-developed.  HENT:     Head: Normocephalic and atraumatic.     Right Ear: Tympanic membrane and external ear normal.     Left Ear: Tympanic membrane and external ear normal.     Nose: Nose normal.     Mouth/Throat:     Mouth: Mucous membranes are moist.     Pharynx: Oropharynx is clear.  Eyes:     Conjunctiva/sclera: Conjunctivae normal.  Cardiovascular:     Rate and Rhythm: Normal rate and regular rhythm.     Heart sounds: Normal heart sounds. No murmur heard.   No friction rub. No gallop.  Pulmonary:     Effort: Pulmonary effort is normal.     Breath sounds: Normal breath sounds. No wheezing, rhonchi or rales.  Musculoskeletal:     Cervical back: Neck  supple.  Skin:    General: Skin is warm.     Findings: No rash.  Neurological:     Mental Status: He is alert and oriented to person, place, and time.  Psychiatric:        Behavior: Behavior normal.   Previous notes and tests were reviewed. The plan was reviewed with the patient/family, and all questions/concerned were addressed.  It was my pleasure to see Markevius today and participate in his care. Please feel free to contact me with any questions or concerns.  Sincerely,  Rexene Alberts, DO Allergy & Immunology  [facility] office: [place] office: [phone]

## 2021-04-16 NOTE — Assessment & Plan Note (Signed)
Past history - Persistent coughing with episodes of wheezing and shortness of breath since October 2021. Recent flare after traveling to [place] requiring Z-Pak, prednisone.  Recently started on Singulair and albuterol as needed with good benefit.  No prior asthma diagnosis.  2022 spirometry showed some restriction with 3% improvement in FEV1 post bronchodilator treatment.  Clinically feeling unchanged. Interim history - coughing resolved and breathing is better with Flovent. Stopped Singulair with no worsening symptoms.  Today's spirometry was normal.   Daily controller medication(s): DECREASE Flovent 118mcg to ONE puff once a day with spacer and rinse mouth afterwards. o If you notice worsening symptoms then go back up to 2 puffs once a day.  Restart Singulair (montelukast) 10mg  daily.  During upper respiratory infections/breathing flares:  o Start Flovent 191mcg 2 puffs twice a day for 1-2 weeks until your breathing symptoms return to baseline.  o Pretreat with albuterol 2 puffs.  May use albuterol rescue inhaler 2 puffs every 4 to 6 hours as needed for shortness of breath, chest tightness, coughing, and wheezing. May use albuterol rescue inhaler 2 puffs 5 to 15 minutes prior to strenuous physical activities. Monitor frequency of use.   Get spirometry at next visit.  Consider stopping Flovent at next visit if doing well.

## 2021-04-16 NOTE — Patient Instructions (Addendum)
Coughing: Daily controller medication(s): DECREASE Flovent 175mcg to ONE puff once a day with spacer and rinse mouth afterwards. If you notice worsening symptoms then go back up to 2 puffs once a day. Restart Singulair (montelukast) 10mg  daily. During upper respiratory infections/breathing flares:  Start Flovent 171mcg 2 puffs twice a day for 1-2 weeks until your breathing symptoms return to baseline.  Pretreat with albuterol 2 puffs. May use albuterol rescue inhaler 2 puffs every 4 to 6 hours as needed for shortness of breath, chest tightness, coughing, and wheezing. May use albuterol rescue inhaler 2 puffs 5 to 15 minutes prior to strenuous physical activities. Monitor frequency of use.  Breathing control goals:  Full participation in all desired activities (may need albuterol before activity) Albuterol use two times or less a week on average (not counting use with activity) Cough interfering with sleep two times or less a month Oral steroids no more than once a year No hospitalizations   Environmental allergies 2022 skin testing showed: Positive to dust mites, grass, mold, and cockroach. Continue environmental control measures as below. Stop Clarinex daily. Monitor urine flow and nasal symptoms. Restart Singulair (montelukast) 10mg  daily.   Heartburn: Continue lifestyle and dietary modifications.  Voice change Recommend ENT evaluation.  Urine/prostate  Recommend seeing urology next if not improving.   Follow up in 4 months or sooner if needed.

## 2021-06-27 ENCOUNTER — Other Ambulatory Visit: Payer: Self-pay | Admitting: Cardiology

## 2021-07-02 ENCOUNTER — Other Ambulatory Visit: Payer: Self-pay | Admitting: Cardiology

## 2021-07-16 DIAGNOSIS — K760 Fatty (change of) liver, not elsewhere classified: Secondary | ICD-10-CM | POA: Diagnosis not present

## 2021-07-16 DIAGNOSIS — E1169 Type 2 diabetes mellitus with other specified complication: Secondary | ICD-10-CM | POA: Diagnosis not present

## 2021-07-24 DIAGNOSIS — R634 Abnormal weight loss: Secondary | ICD-10-CM | POA: Diagnosis not present

## 2021-07-24 DIAGNOSIS — R39198 Other difficulties with micturition: Secondary | ICD-10-CM | POA: Diagnosis not present

## 2021-07-24 DIAGNOSIS — E1169 Type 2 diabetes mellitus with other specified complication: Secondary | ICD-10-CM | POA: Diagnosis not present

## 2021-08-04 ENCOUNTER — Other Ambulatory Visit: Payer: Self-pay | Admitting: Allergy

## 2021-08-25 DIAGNOSIS — M25552 Pain in left hip: Secondary | ICD-10-CM | POA: Diagnosis not present

## 2021-08-25 DIAGNOSIS — M25551 Pain in right hip: Secondary | ICD-10-CM | POA: Diagnosis not present

## 2021-08-27 ENCOUNTER — Ambulatory Visit: Payer: BC Managed Care – PPO | Admitting: Allergy

## 2021-08-28 DIAGNOSIS — M6281 Muscle weakness (generalized): Secondary | ICD-10-CM | POA: Diagnosis not present

## 2021-08-28 DIAGNOSIS — M7061 Trochanteric bursitis, right hip: Secondary | ICD-10-CM | POA: Diagnosis not present

## 2021-08-29 DIAGNOSIS — R3912 Poor urinary stream: Secondary | ICD-10-CM | POA: Diagnosis not present

## 2021-08-29 DIAGNOSIS — R350 Frequency of micturition: Secondary | ICD-10-CM | POA: Diagnosis not present

## 2021-08-29 DIAGNOSIS — N401 Enlarged prostate with lower urinary tract symptoms: Secondary | ICD-10-CM | POA: Diagnosis not present

## 2021-08-29 DIAGNOSIS — R351 Nocturia: Secondary | ICD-10-CM | POA: Diagnosis not present

## 2021-08-31 DIAGNOSIS — W57XXXA Bitten or stung by nonvenomous insect and other nonvenomous arthropods, initial encounter: Secondary | ICD-10-CM | POA: Diagnosis not present

## 2021-08-31 DIAGNOSIS — S80862A Insect bite (nonvenomous), left lower leg, initial encounter: Secondary | ICD-10-CM | POA: Diagnosis not present

## 2021-08-31 DIAGNOSIS — L089 Local infection of the skin and subcutaneous tissue, unspecified: Secondary | ICD-10-CM | POA: Diagnosis not present

## 2021-09-10 DIAGNOSIS — M7061 Trochanteric bursitis, right hip: Secondary | ICD-10-CM | POA: Diagnosis not present

## 2021-09-10 DIAGNOSIS — M6281 Muscle weakness (generalized): Secondary | ICD-10-CM | POA: Diagnosis not present

## 2021-09-22 ENCOUNTER — Encounter: Payer: Self-pay | Admitting: Allergy

## 2021-09-22 ENCOUNTER — Ambulatory Visit (INDEPENDENT_AMBULATORY_CARE_PROVIDER_SITE_OTHER): Payer: BC Managed Care – PPO | Admitting: Allergy

## 2021-09-22 VITALS — BP 128/72 | HR 73 | Temp 98.0°F | Ht 69.0 in | Wt 225.0 lb

## 2021-09-22 DIAGNOSIS — R499 Unspecified voice and resonance disorder: Secondary | ICD-10-CM | POA: Diagnosis not present

## 2021-09-22 DIAGNOSIS — J4530 Mild persistent asthma, uncomplicated: Secondary | ICD-10-CM

## 2021-09-22 DIAGNOSIS — E782 Mixed hyperlipidemia: Secondary | ICD-10-CM | POA: Insufficient documentation

## 2021-09-22 DIAGNOSIS — J3089 Other allergic rhinitis: Secondary | ICD-10-CM

## 2021-09-22 DIAGNOSIS — J302 Other seasonal allergic rhinitis: Secondary | ICD-10-CM

## 2021-09-22 DIAGNOSIS — J309 Allergic rhinitis, unspecified: Secondary | ICD-10-CM | POA: Insufficient documentation

## 2021-09-22 DIAGNOSIS — K760 Fatty (change of) liver, not elsewhere classified: Secondary | ICD-10-CM | POA: Insufficient documentation

## 2021-09-22 DIAGNOSIS — R1310 Dysphagia, unspecified: Secondary | ICD-10-CM | POA: Insufficient documentation

## 2021-09-22 DIAGNOSIS — J45909 Unspecified asthma, uncomplicated: Secondary | ICD-10-CM | POA: Insufficient documentation

## 2021-09-22 DIAGNOSIS — K222 Esophageal obstruction: Secondary | ICD-10-CM | POA: Insufficient documentation

## 2021-09-22 DIAGNOSIS — K5730 Diverticulosis of large intestine without perforation or abscess without bleeding: Secondary | ICD-10-CM | POA: Insufficient documentation

## 2021-09-22 DIAGNOSIS — K219 Gastro-esophageal reflux disease without esophagitis: Secondary | ICD-10-CM

## 2021-09-22 DIAGNOSIS — K2930 Chronic superficial gastritis without bleeding: Secondary | ICD-10-CM | POA: Insufficient documentation

## 2021-09-22 DIAGNOSIS — N401 Enlarged prostate with lower urinary tract symptoms: Secondary | ICD-10-CM | POA: Insufficient documentation

## 2021-09-22 MED ORDER — RYALTRIS 665-25 MCG/ACT NA SUSP: 1.0000 | Freq: Two times a day (BID) | NASAL | 5 refills | Status: DC

## 2021-09-22 NOTE — Progress Notes (Signed)
Follow Up Note  RE: Kyle Baird MRN: [id number] DOB: 20-Sep-1955 Date of Office Visit: 09/22/2021  Referring provider: Lawerance Cruel, MD Primary care provider: Lawerance Cruel, MD  Chief Complaint: Follow-up  History of Present Illness: I had the pleasure of seeing Kyle Baird for a follow up visit at the [facility] [place] on 09/22/2021. He is a 66 y.o. male, who is being followed for reactive airway disease, allergic rhinitis, GERD. His previous allergy office visit was on 04/16/2021 with Dr. [name]. Today is a regular follow up visit.  Breathing  Currently on Flovent 172mg 1 puff once a day with no worsening of symptoms. Sometimes coughing in the mornings only but no wheezing, chest tightness.  Seasonal and perennial allergic rhinitis Tried to stop Clarinex but it made the nasal congestion worse.  This usually occurs in the mornings. Not using any nasal sprays.   Heartburn Asymptomatic.    Change of voice Unchanged but not bad enough to see ENT yet.  Seeing urology. Currently taking Flomax.  He is back on antihistamines and not sure if it made a difference being off of them in regards to his urinary flow.   Assessment and Plan: PFrandyis a 66y.o. male with: Reactive airway disease without complication Past history - Persistent coughing with episodes of wheezing and shortness of breath since October 2021. [place]  Recently started on Singulair and albuterol as needed with good benefit.  No prior asthma diagnosis.  2022 spirometry showed some restriction with 3% improvement in FEV1 post bronchodilator treatment.  Clinically feeling unchanged. Interim history - no worsening symptoms with lower dose of Flovent. Today's spirometry was normal.  Daily controller medication(s): stop Flovent completely  If you notice worsening symptoms then go back up to 2 puffs once a day and let u[place]ore[place]now.  During upper  respiratory infections/breathing flares:  Start Flovent 1138m 2 puffs twice a day for 1-2 weeks until your breathing symptoms return to baseline.  Pretreat with albuterol 2 puffs. May use albuterol rescue inhaler 2 puffs every 4 to 6 hours as needed for shortness of breath, chest tightness, coughing, and wheezing. May use albuterol rescue inhaler 2 puffs 5 to 15 minutes prior to strenuous physical activities. Monitor frequency of use.  Get spirometry at next visit.  Seasonal and perennial allergic rhinitis Past history - Rhinitis symptoms may in the spring and fall.  Takes Clarinex daily with good benefit. 2022 skin testing showed: Positive to dust mites, grass, mold, and cockroach. Interim history - nasal congestion worse off Clarinex.  Continue environmental control measures as below. Continue Clarinex daily. Monitor urine flow and nasal symptoms. Continue Singulair (montelukast) '10mg'$  daily.  Start Ryaltris (olopatadine + mometasone nasal spray combination) 1-2 sprays per nostril twice a day. Sample given. This replaces your other nasal sprays. If this works well for you, then have [facility] ship the medication to your home - prescription already sent in.   Gastroesophageal reflux disease Past history - Had EGD this summer requiring dilatation. Complained of dysphagia. Reviewed GI notes - had candida esophagitis. Interim history - asymptomatic.  Continue lifestyle and dietary modifications.  Change of voice Unchanged. Recommend ENT evaluation if worsening.   Return in about 4 months (around 01/23/2022).  Meds ordered this encounter  Medications   Olopatadine-Mometasone (RYALTRIS) 665-25 MCG/ACT SUSP    Sig: Place 1-2 sprays into the nose in the morning and at bedtime.  Dispense:  29 g    Refill:  5    [phone]   Lab Orders  No laboratory test(s) ordered today    Diagnostics: Spirometry:  Tracings reviewed. His effort: Good reproducible efforts. FVC: 3.27L FEV1:  2.56L, 78% predicted FEV1/FVC ratio: 78% Interpretation: Spirometry consistent with normal pattern.  Please see scanned spirometry results for details.  Medication List:  Current Outpatient Medications  Medication Sig Dispense Refill   albuterol (VENTOLIN HFA) 108 (90 Base) MCG/ACT inhaler Inhale 2 puffs into the lungs every 6 (six) hours as needed.     aspirin EC 81 MG tablet Take 81 mg by mouth daily.     Cholecalciferol (VITAMIN D PO) Take 1 tablet by mouth daily.     desloratadine (CLARINEX) 5 MG tablet TAKE 1 TABLET (5 MG TOTAL) BY MOUTH DAILY. 90 tablet 0   empagliflozin (JARDIANCE) 25 MG TABS tablet 10 mg.     ezetimibe (ZETIA) 10 MG tablet TAKE 1 TABLET BY MOUTH EVERY DAY 90 tablet 2   ibuprofen (ADVIL,MOTRIN) 200 MG tablet Take 600 mg by mouth every 6 (six) hours as needed.     montelukast (SINGULAIR) 10 MG tablet Take 1 tablet (10 mg total) by mouth daily. 30 tablet 5   Multiple Vitamin (MULTIVITAMIN) tablet Take 1 tablet by mouth daily.     Olopatadine-Mometasone (RYALTRIS) [id number] MCG/ACT SUSP Place 1-2 sprays into the nose in the morning and at bedtime. 29 g 5   Omega-3 Fatty Acids (FISH OIL PO) Take 2 tablets by mouth daily.      rosuvastatin (CRESTOR) 20 MG tablet TAKE 1 TABLET BY MOUTH EVERY DAY 90 tablet 3   Sodium Chloride, Hypertonic, (SOCHLOR OP) Apply to eye as needed.     tamsulosin (FLOMAX) 0.4 MG CAPS capsule Take 1 capsule (0.4 mg total) by mouth daily after supper. (Patient taking differently: Take 0.8 mg by mouth daily after supper.) 30 capsule 0   vitamin E 400 UNIT capsule Take 400 Units by mouth daily.     No current facility-administered medications for this visit.   Allergies: Allergies  Allergen Reactions   Budesonide-Formoterol Fumarate     Other reaction(s): candida   Penicillins Hives    Has patient had a PCN reaction causing immediate rash, facial/tongue/throat swelling, SOB or lightheadedness with hypotension: no Has patient had a PCN reaction  causing severe rash involving mucus membranes or skin necrosis: yes Has patient had a PCN reaction that required hospitalization: no Has patient had a PCN reaction occurring within the last 10 years: no If all of the above answers are "NO", then may proceed with Cephalosporin use.    Trimox [Amoxicillin] Hives   I reviewed his past medical history, social history, family history, and environmental history and no significant changes have been reported from his previous visit.  Review of Systems  Constitutional:  Negative for appetite change, chills, fever and unexpected weight change.  HENT:  Positive for congestion and voice change. Negative for rhinorrhea.   Eyes:  Negative for itching.  Respiratory:  Negative for cough, chest tightness, shortness of breath and wheezing.   Cardiovascular:  Negative for chest pain.  Gastrointestinal:  Negative for abdominal pain.  Skin:  Negative for rash.  Allergic/Immunologic: Positive for environmental allergies. Negative for food allergies.  Neurological:  Negative for headaches.    Objective: BP 128/72   Pulse 73   Temp 98 F (36.7 C) (Temporal)   Ht '5\' 9"'$  (1.753 m)   Wt 225 lb (102.1 kg)  SpO2 97%   BMI 33.23 kg/m  Body mass index is 33.23 kg/m. Physical Exam Vitals and nursing note reviewed.  Constitutional:      Appearance: Normal appearance. He is well-developed.  HENT:     Head: Normocephalic and atraumatic.     Right Ear: Tympanic membrane and external ear normal.     Left Ear: Tympanic membrane and external ear normal.     Nose: Congestion (on right side) present.     Mouth/Throat:     Mouth: Mucous membranes are moist.     Pharynx: Oropharynx is clear.  Eyes:     Conjunctiva/sclera: Conjunctivae normal.  Cardiovascular:     Rate and Rhythm: Normal rate and regular rhythm.     Heart sounds: Normal heart sounds. No murmur heard.    No friction rub. No gallop.  Pulmonary:     Effort: Pulmonary effort is normal.      Breath sounds: Normal breath sounds. No wheezing, rhonchi or rales.  Musculoskeletal:     Cervical back: Neck supple.  Skin:    General: Skin is warm.     Findings: No rash.  Neurological:     Mental Status: He is alert and oriented to person, place, and time.  Psychiatric:        Behavior: Behavior normal.    Previous notes and tests were reviewed. The plan was reviewed with the patient/family, and all questions/concerned were addressed.  It was my pleasure to see Kyle Baird today and participate in his care. Please feel free to contact me with any questions or concerns.  Sincerely,  Rexene Alberts, DO Allergy & Immunology  [facility] office: [place] office: [phone]

## 2021-09-22 NOTE — Assessment & Plan Note (Signed)
Unchanged. . Recommend ENT evaluation if worsening.

## 2021-09-22 NOTE — Patient Instructions (Addendum)
Coughing: Daily controller medication(s): stop Flovent completely  If you notice worsening symptoms then go back up to 2 puffs once a day and let us know.  During upper respiratory infections/breathing flares:  Start Flovent 158mg 2 puffs twice a day for 1-2 weeks until your breathing symptoms return to baseline.  Pretreat with albuterol 2 puffs. May use albuterol rescue inhaler 2 puffs every 4 to 6 hours as needed for shortness of breath, chest tightness, coughing, and wheezing. May use albuterol rescue inhaler 2 puffs 5 to 15 minutes prior to strenuous physical activities. Monitor frequency of use.  Breathing control goals:  Full participation in all desired activities (may need albuterol before activity) Albuterol use two times or less a week on average (not counting use with activity) Cough interfering with sleep two times or less a month Oral steroids no more than once a year No hospitalizations   Environmental allergies 2022 skin testing showed: Positive to dust mites, grass, mold, and cockroach. Continue environmental control measures as below. Continue Clarinex daily. Monitor urine flow and nasal symptoms. Continue Singulair (montelukast) '10mg'$  daily.  Start Ryaltris (olopatadine + mometasone nasal spray combination) 1-2 sprays per nostril twice a day. Sample given. This replaces your other nasal sprays. If this works well for you, then have [facility] ship the medication to your home - prescription already sent in.   Heartburn: Continue lifestyle and dietary modifications.  Voice change Recommend ENT evaluation if worsening.   Follow up in 4 months or sooner if needed.

## 2021-09-22 NOTE — Assessment & Plan Note (Signed)
Past history - Rhinitis symptoms may in the spring and fall.  Takes Clarinex daily with good benefit. 2022 skin testing showed: Positive to dust mites, grass, mold, and cockroach. Interim history - nasal congestion worse off Clarinex.   Continue environmental control measures as below.  Continue Clarinex daily.  Monitor urine flow and nasal symptoms.  Continue Singulair (montelukast) '10mg'$  daily.   Start Ryaltris (olopatadine + mometasone nasal spray combination) 1-2 sprays per nostril twice a day. Sample given.  This replaces your other nasal sprays.  If this works well for you, then have [facility] ship the medication to your home - prescription already sent in.

## 2021-09-22 NOTE — Assessment & Plan Note (Signed)
Past history - Persistent coughing with episodes of wheezing and shortness of breath since October 2021. Recent flare after traveling to [place] requiring Z-Pak, prednisone.  Recently started on Singulair and albuterol as needed with good benefit.  No prior asthma diagnosis.  2022 spirometry showed some restriction with 3% improvement in FEV1 post bronchodilator treatment.  Clinically feeling unchanged. Interim history - no worsening symptoms with lower dose of Flovent.  Today's spirometry was normal.  . Daily controller medication(s): stop Flovent completely  o If you notice worsening symptoms then go back up to 2 puffs once a day and let us know.  . During upper respiratory infections/breathing flares:  o Start Flovent 124mg 2 puffs twice a day for 1-2 weeks until your breathing symptoms return to baseline.  o Pretreat with albuterol 2 puffs. . May use albuterol rescue inhaler 2 puffs every 4 to 6 hours as needed for shortness of breath, chest tightness, coughing, and wheezing. May use albuterol rescue inhaler 2 puffs 5 to 15 minutes prior to strenuous physical activities. Monitor frequency of use.  . Get spirometry at next visit.

## 2021-09-22 NOTE — Assessment & Plan Note (Signed)
Past history - Had EGD this summer requiring dilatation. Complained of dysphagia. Reviewed GI notes - had candida esophagitis. Interim history - asymptomatic.   Continue lifestyle and dietary modifications.

## 2021-09-29 DIAGNOSIS — M7062 Trochanteric bursitis, left hip: Secondary | ICD-10-CM | POA: Diagnosis not present

## 2021-09-29 DIAGNOSIS — M25552 Pain in left hip: Secondary | ICD-10-CM | POA: Diagnosis not present

## 2021-10-21 DIAGNOSIS — H25813 Combined forms of age-related cataract, bilateral: Secondary | ICD-10-CM | POA: Diagnosis not present

## 2021-10-21 DIAGNOSIS — H524 Presbyopia: Secondary | ICD-10-CM | POA: Diagnosis not present

## 2021-10-21 DIAGNOSIS — H11153 Pinguecula, bilateral: Secondary | ICD-10-CM | POA: Diagnosis not present

## 2021-10-21 DIAGNOSIS — H04123 Dry eye syndrome of bilateral lacrimal glands: Secondary | ICD-10-CM | POA: Diagnosis not present

## 2021-10-21 DIAGNOSIS — E119 Type 2 diabetes mellitus without complications: Secondary | ICD-10-CM | POA: Diagnosis not present

## 2021-10-29 ENCOUNTER — Other Ambulatory Visit: Payer: Self-pay | Admitting: Allergy

## 2021-12-01 DIAGNOSIS — M25552 Pain in left hip: Secondary | ICD-10-CM | POA: Diagnosis not present

## 2021-12-08 DIAGNOSIS — R3912 Poor urinary stream: Secondary | ICD-10-CM | POA: Diagnosis not present

## 2021-12-08 DIAGNOSIS — R351 Nocturia: Secondary | ICD-10-CM | POA: Diagnosis not present

## 2021-12-08 DIAGNOSIS — R350 Frequency of micturition: Secondary | ICD-10-CM | POA: Diagnosis not present

## 2021-12-08 DIAGNOSIS — N401 Enlarged prostate with lower urinary tract symptoms: Secondary | ICD-10-CM | POA: Diagnosis not present

## 2022-01-16 ENCOUNTER — Other Ambulatory Visit: Payer: Self-pay | Admitting: Allergy

## 2022-01-18 NOTE — Progress Notes (Unsigned)
Follow Up Note  RE: [name] MRN: [id number] DOB: Jan 20, 1956 Date of Office Visit: 01/19/2022  Referring provider: Lawerance Cruel, MD Primary care provider: Lawerance Cruel, MD  Chief Complaint: No chief complaint on file.  History of Present Illness: I had the pleasure of seeing [name] for a follow up visit at the [facility] [place] on 01/18/2022. He is a 66 y.o. male, who is being followed for reactive airway disease, allergic rhinitis, GERD. His previous allergy office visit was on 09/22/2021 with Dr. [name]. Today is a regular follow up visit.  Reactive airway disease without complication Past history - Persistent coughing with episodes of wheezing and shortness of breath since October 2021. Recent flare after traveling to [place] requiring Z-Pak, prednisone.  Recently started on Singulair and albuterol as needed with good benefit.  No prior asthma diagnosis.  2022 spirometry showed some restriction with 3% improvement in FEV1 post bronchodilator treatment.  Clinically feeling unchanged. Interim history - no worsening symptoms with lower dose of Flovent. Today's spirometry was normal.  Daily controller medication(s): stop Flovent completely  If you notice worsening symptoms then go back up to 2 puffs once a day and let us know.  During upper respiratory infections/breathing flares:  Start Flovent 173mg 2 puffs twice a day for 1-2 weeks until your breathing symptoms return to baseline.  Pretreat with albuterol 2 puffs. May use albuterol rescue inhaler 2 puffs every 4 to 6 hours as needed for shortness of breath, chest tightness, coughing, and wheezing. May use albuterol rescue inhaler 2 puffs 5 to 15 minutes prior to strenuous physical activities. Monitor frequency of use.  Get spirometry at next visit.   Seasonal and perennial allergic rhinitis Past history - Rhinitis symptoms may in the spring and fall.  Takes Clarinex daily with good benefit. 2022 skin  testing showed: Positive to dust mites, grass, mold, and cockroach. Interim history - nasal congestion worse off Clarinex.  Continue environmental control measures as below. Continue Clarinex daily. Monitor urine flow and nasal symptoms. Continue Singulair (montelukast) '10mg'$  daily.  Start Ryaltris (olopatadine + mometasone nasal spray combination) 1-2 sprays per nostril twice a day. Sample given. This replaces your other nasal sprays. If this works well for you, then have [facility] ship the medication to your home - prescription already sent in.    Gastroesophageal reflux disease Past history - Had EGD this summer requiring dilatation. Complained of dysphagia. Reviewed GI notes - had candida esophagitis. Interim history - asymptomatic.  Continue lifestyle and dietary modifications.   Change of voice Unchanged. Recommend ENT evaluation if worsening.    Return in about 4 months (around 01/23/2022).  Assessment and Plan: PPetrais a 66y.o. male with: No problem-specific Assessment & Plan notes found for this encounter.  No follow-ups on file.  No orders of the defined types were placed in this encounter.  Lab Orders  No laboratory test(s) ordered today    Diagnostics: Spirometry:  Tracings reviewed. His effort: {Blank single:19197::"Good reproducible efforts.","It was hard to get consistent efforts and there is a question as to whether this reflects a maximal maneuver.","Poor effort, data can not be interpreted."} FVC: ***L FEV1: ***L, ***% predicted FEV1/FVC ratio: ***% Interpretation: {Blank single:19197::"Spirometry consistent with mild obstructive disease","Spirometry consistent with moderate obstructive disease","Spirometry consistent with severe obstructive disease","Spirometry consistent with possible restrictive disease","Spirometry consistent with mixed obstructive and restrictive disease","Spirometry uninterpretable due to technique","Spirometry consistent with normal  pattern","No overt abnormalities noted given today's efforts"}.  Please  see scanned spirometry results for details.  Skin Testing: {Blank single:19197::"Select foods","Environmental allergy panel","Environmental allergy panel and select foods","Food allergy panel","None","Deferred due to recent antihistamines use"}. *** Results discussed with patient/family.   Medication List:  Current Outpatient Medications  Medication Sig Dispense Refill   desloratadine (CLARINEX) 5 MG tablet TAKE 1 TABLET (5 MG TOTAL) BY MOUTH DAILY. 90 tablet 0   albuterol (VENTOLIN HFA) 108 (90 Base) MCG/ACT inhaler Inhale 2 puffs into the lungs every 6 (six) hours as needed.     aspirin EC 81 MG tablet Take 81 mg by mouth daily.     Cholecalciferol (VITAMIN D PO) Take 1 tablet by mouth daily.     empagliflozin (JARDIANCE) 25 MG TABS tablet 10 mg.     ezetimibe (ZETIA) 10 MG tablet TAKE 1 TABLET BY MOUTH EVERY DAY 90 tablet 2   fluticasone (FLOVENT HFA) 110 MCG/ACT inhaler INHALE 2 PUFFS INTO THE LUNGS DAILY. WITH SPACER AND RINSE MOUTH AFTERWARDS. 12 each 0   ibuprofen (ADVIL,MOTRIN) 200 MG tablet Take 600 mg by mouth every 6 (six) hours as needed.     montelukast (SINGULAIR) 10 MG tablet Take 1 tablet (10 mg total) by mouth daily. 30 tablet 5   Multiple Vitamin (MULTIVITAMIN) tablet Take 1 tablet by mouth daily.     Olopatadine-Mometasone (RYALTRIS) [id number] MCG/ACT SUSP Place 1-2 sprays into the nose in the morning and at bedtime. 29 g 5   Omega-3 Fatty Acids (FISH OIL PO) Take 2 tablets by mouth daily.      rosuvastatin (CRESTOR) 20 MG tablet TAKE 1 TABLET BY MOUTH EVERY DAY 90 tablet 3   Sodium Chloride, Hypertonic, (SOCHLOR OP) Apply to eye as needed.     tamsulosin (FLOMAX) 0.4 MG CAPS capsule Take 1 capsule (0.4 mg total) by mouth daily after supper. (Patient taking differently: Take 0.8 mg by mouth daily after supper.) 30 capsule 0   vitamin E 400 UNIT capsule Take 400 Units by mouth daily.     No current  facility-administered medications for this visit.   Allergies: Allergies  Allergen Reactions   Budesonide-Formoterol Fumarate     Other reaction(s): candida   Penicillins Hives    Has patient had a PCN reaction causing immediate rash, facial/tongue/throat swelling, SOB or lightheadedness with hypotension: no Has patient had a PCN reaction causing severe rash involving mucus membranes or skin necrosis: yes Has patient had a PCN reaction that required hospitalization: no Has patient had a PCN reaction occurring within the last 10 years: no If all of the above answers are "NO", then may proceed with Cephalosporin use.    Trimox [Amoxicillin] Hives   I reviewed his past medical history, social history, family history, and environmental history and no significant changes have been reported from his previous visit.  Review of Systems  Constitutional:  Negative for appetite change, chills, fever and unexpected weight change.  HENT:  Positive for congestion and voice change. Negative for rhinorrhea.   Eyes:  Negative for itching.  Respiratory:  Negative for cough, chest tightness, shortness of breath and wheezing.   Cardiovascular:  Negative for chest pain.  Gastrointestinal:  Negative for abdominal pain.  Skin:  Negative for rash.  Allergic/Immunologic: Positive for environmental allergies. Negative for food allergies.  Neurological:  Negative for headaches.    Objective: There were no vitals taken for this visit. There is no height or weight on file to calculate BMI. Physical Exam Vitals and nursing note reviewed.  Constitutional:  Appearance: Normal appearance. He is well-developed.  HENT:     Head: Normocephalic and atraumatic.     Right Ear: Tympanic membrane and external ear normal.     Left Ear: Tympanic membrane and external ear normal.     Nose: Congestion (on right side) present.     Mouth/Throat:     Mouth: Mucous membranes are moist.     Pharynx: Oropharynx is  clear.  Eyes:     Conjunctiva/sclera: Conjunctivae normal.  Cardiovascular:     Rate and Rhythm: Normal rate and regular rhythm.     Heart sounds: Normal heart sounds. No murmur heard.    No friction rub. No gallop.  Pulmonary:     Effort: Pulmonary effort is normal.     Breath sounds: Normal breath sounds. No wheezing, rhonchi or rales.  Musculoskeletal:     Cervical back: Neck supple.  Skin:    General: Skin is warm.     Findings: No rash.  Neurological:     Mental Status: He is alert and oriented to person, place, and time.  Psychiatric:        Behavior: Behavior normal.    Previous notes and tests were reviewed. The plan was reviewed with the patient/family, and all questions/concerned were addressed.  It was my pleasure to see Kyle Baird today and participate in his care. Please feel free to contact me with any questions or concerns.  Sincerely,  Rexene Alberts, DO Allergy & Immunology  [facility] office: [place] office: [phone]

## 2022-01-19 ENCOUNTER — Encounter: Payer: Self-pay | Admitting: Allergy

## 2022-01-19 ENCOUNTER — Ambulatory Visit (INDEPENDENT_AMBULATORY_CARE_PROVIDER_SITE_OTHER): Payer: BC Managed Care – PPO | Admitting: Allergy

## 2022-01-19 VITALS — BP 114/64 | HR 90 | Temp 97.8°F | Resp 18

## 2022-01-19 DIAGNOSIS — J4530 Mild persistent asthma, uncomplicated: Secondary | ICD-10-CM | POA: Diagnosis not present

## 2022-01-19 DIAGNOSIS — K219 Gastro-esophageal reflux disease without esophagitis: Secondary | ICD-10-CM

## 2022-01-19 DIAGNOSIS — J302 Other seasonal allergic rhinitis: Secondary | ICD-10-CM

## 2022-01-19 DIAGNOSIS — J3089 Other allergic rhinitis: Secondary | ICD-10-CM

## 2022-01-19 DIAGNOSIS — R499 Unspecified voice and resonance disorder: Secondary | ICD-10-CM

## 2022-01-19 MED ORDER — OMEPRAZOLE 20 MG PO CPDR: 20.0000 mg | DELAYED_RELEASE_CAPSULE | Freq: Every day | ORAL | 1 refills | Status: DC

## 2022-01-19 NOTE — Assessment & Plan Note (Signed)
Past history - Persistent coughing with episodes of wheezing and shortness of breath since October 2021. Recent flare after traveling to [place] requiring Z-Pak, prednisone.  Recently started on Singulair and albuterol as needed with good benefit.  No prior asthma diagnosis.  2022 spirometry showed some restriction with 3% improvement in FEV1 post bronchodilator treatment.  Clinically feeling unchanged. Interim history - had Covid-19 in September, back on Flovent 126mg 1 puff once a day. Today's spirometry was normal.  Daily controller medication(s): stop Flovent completely  If you notice worsening symptoms then go back up to 2 puffs once a day and let u[place]ore[place]now.  During upper respiratory infections/breathing flares:  Start Flovent 1171m 2 puffs twice a day for 1-2 weeks until your breathing symptoms return to baseline.  Pretreat with albuterol 2 puffs. May use albuterol rescue inhaler 2 puffs every 4 to 6 hours as needed for shortness of breath, chest tightness, coughing, and wheezing. May use albuterol rescue inhaler 2 puffs 5 to 15 minutes prior to strenuous physical activities. Monitor frequency of use.  Get spirometry at next visit. Letter written to allow medications with him while traveling.

## 2022-01-19 NOTE — Assessment & Plan Note (Signed)
Improved. Recommend ENT evaluation if worsening.  Dr. Rowe Clack at [facility] is a voice specialist.

## 2022-01-19 NOTE — Assessment & Plan Note (Signed)
Past history - Had EGD this summer requiring dilatation. Complained of dysphagia. Reviewed GI notes - had candida esophagitis. Interim history - noted dysphagia symptoms again. Continue lifestyle and dietary modifications. Take omeprazole '20mg'$  daily in the morning. Nothing to eat or drink for 30 minutes. Follow up with GI.

## 2022-01-19 NOTE — Patient Instructions (Addendum)
Coughing: Daily controller medication(s): stop Flovent completely  If you notice worsening symptoms then go back up to 2 puffs once a day and let us know.  During upper respiratory infections/breathing flares:  Start Flovent 166mg 2 puffs twice a day for 1-2 weeks until your breathing symptoms return to baseline.  Pretreat with albuterol 2 puffs. May use albuterol rescue inhaler 2 puffs every 4 to 6 hours as needed for shortness of breath, chest tightness, coughing, and wheezing. May use albuterol rescue inhaler 2 puffs 5 to 15 minutes prior to strenuous physical activities. Monitor frequency of use.  Breathing control goals:  Full participation in all desired activities (may need albuterol before activity) Albuterol use two times or less a week on average (not counting use with activity) Cough interfering with sleep two times or less a month Oral steroids no more than once a year No hospitalizations   Environmental allergies 2022 skin testing showed: Positive to dust mites, grass, mold, and cockroach. Continue environmental control measures as below. Continue Clarinex daily. Continue Singulair (montelukast) '10mg'$  daily.  May use Ryaltris (olopatadine + mometasone nasal spray combination) 1-2 sprays per nostril twice a day.  Use before going to bed.  This replaces your other nasal sprays. If this works well for you, then have [facility] ship the medication to your home - prescription already sent in.   Heartburn: Continue lifestyle and dietary modifications. Take omeprazole '20mg'$  daily in the morning. Nothing to eat or drink for 30 minutes. Follow up with GI.   Voice change Recommend ENT evaluation if worsening.  [facility].  [phone] Follow up in 4 months or sooner if needed.

## 2022-01-19 NOTE — Assessment & Plan Note (Signed)
Past history - Rhinitis symptoms may in the spring and fall.  Takes Clarinex daily with good benefit. 2022 skin testing showed: Positive to dust mites, grass, mold, and cockroach. Interim history - controlled with Clarinex and Singulair. Continue environmental control measures as below. Continue Clarinex daily. Continue Singulair (montelukast) '10mg'$  daily.  May use Ryaltris (olopatadine + mometasone nasal spray combination) 1-2 sprays per nostril twice a day.  Use before going to bed.  This replaces your other nasal sprays. If this works well for you, then have [facility] ship the medication to your home - prescription already sent in.

## 2022-01-26 DIAGNOSIS — E1169 Type 2 diabetes mellitus with other specified complication: Secondary | ICD-10-CM | POA: Diagnosis not present

## 2022-01-26 DIAGNOSIS — Z0000 Encounter for general adult medical examination without abnormal findings: Secondary | ICD-10-CM | POA: Diagnosis not present

## 2022-01-27 ENCOUNTER — Other Ambulatory Visit: Payer: Self-pay | Admitting: Allergy

## 2022-02-04 DIAGNOSIS — Z0000 Encounter for general adult medical examination without abnormal findings: Secondary | ICD-10-CM | POA: Diagnosis not present

## 2022-02-04 DIAGNOSIS — E1169 Type 2 diabetes mellitus with other specified complication: Secondary | ICD-10-CM | POA: Diagnosis not present

## 2022-02-04 DIAGNOSIS — Z125 Encounter for screening for malignant neoplasm of prostate: Secondary | ICD-10-CM | POA: Diagnosis not present

## 2022-02-19 DIAGNOSIS — L538 Other specified erythematous conditions: Secondary | ICD-10-CM | POA: Diagnosis not present

## 2022-02-19 DIAGNOSIS — Z789 Other specified health status: Secondary | ICD-10-CM | POA: Diagnosis not present

## 2022-02-19 DIAGNOSIS — L820 Inflamed seborrheic keratosis: Secondary | ICD-10-CM | POA: Diagnosis not present

## 2022-02-19 DIAGNOSIS — L814 Other melanin hyperpigmentation: Secondary | ICD-10-CM | POA: Diagnosis not present

## 2022-02-19 DIAGNOSIS — L2089 Other atopic dermatitis: Secondary | ICD-10-CM | POA: Diagnosis not present

## 2022-02-19 DIAGNOSIS — L821 Other seborrheic keratosis: Secondary | ICD-10-CM | POA: Diagnosis not present

## 2022-02-19 DIAGNOSIS — D1801 Hemangioma of skin and subcutaneous tissue: Secondary | ICD-10-CM | POA: Diagnosis not present

## 2022-02-19 DIAGNOSIS — L298 Other pruritus: Secondary | ICD-10-CM | POA: Diagnosis not present

## 2022-03-25 DIAGNOSIS — N401 Enlarged prostate with lower urinary tract symptoms: Secondary | ICD-10-CM | POA: Diagnosis not present

## 2022-03-25 DIAGNOSIS — R351 Nocturia: Secondary | ICD-10-CM | POA: Diagnosis not present

## 2022-03-25 DIAGNOSIS — R350 Frequency of micturition: Secondary | ICD-10-CM | POA: Diagnosis not present

## 2022-03-25 DIAGNOSIS — N5201 Erectile dysfunction due to arterial insufficiency: Secondary | ICD-10-CM | POA: Diagnosis not present

## 2022-04-03 ENCOUNTER — Telehealth: Payer: Self-pay | Admitting: Cardiology

## 2022-04-03 MED ORDER — EZETIMIBE 10 MG PO TABS: 10.0000 mg | ORAL_TABLET | Freq: Every day | ORAL | 0 refills | Status: DC

## 2022-04-03 NOTE — Telephone Encounter (Signed)
*  STAT* If patient is at the pharmacy, call can be transferred to refill team.   1. Which medications need to be refilled? (please list name of each medication and dose if known)   ezetimibe (ZETIA) 10 MG tablet    2. Which pharmacy/location (including street and city if local pharmacy) is medication to be sent to?   [id number]   3. Do they need a 30 day or 90 day supply? [facility]

## 2022-04-03 NOTE — Telephone Encounter (Signed)
Pt's medication was sent to pt's pharmacy as requested. Confirmation received.

## 2022-04-24 ENCOUNTER — Telehealth: Payer: Self-pay | Admitting: Allergy

## 2022-04-24 MED ORDER — FLUTICASONE PROPIONATE HFA 110 MCG/ACT IN AERO: 2.0000 | INHALATION_SPRAY | Freq: Every day | RESPIRATORY_TRACT | 1 refills | Status: DC

## 2022-04-24 NOTE — Telephone Encounter (Signed)
Called patient to let him know that a refill was sent to pharmacy. LVM.

## 2022-04-24 NOTE — Telephone Encounter (Signed)
Patient is requesting a refill on [facility] to [facility] [address]. He is wondering if this could be sent in ASAP as he is leaving out of town.

## 2022-05-12 DIAGNOSIS — E1169 Type 2 diabetes mellitus with other specified complication: Secondary | ICD-10-CM | POA: Diagnosis not present

## 2022-05-12 DIAGNOSIS — B379 Candidiasis, unspecified: Secondary | ICD-10-CM | POA: Diagnosis not present

## 2022-05-19 NOTE — Progress Notes (Unsigned)
Follow Up Note  RE: Kyle Baird MRN: [id number] DOB: 1955/05/16 Date of Office Visit: 05/20/2022  Referring provider: Lawerance Cruel, MD Primary care provider: Lawerance Cruel, MD  Chief Complaint: No chief complaint on file.  History of Present Illness: I had the pleasure of seeing [name] for a follow up visit at the [facility] [place] [facility] [place] on 05/19/2022. He is a 67 y.o. male, who is being followed for reactive airway disease, allergic rhinitis, GERD. His previous allergy office visit was on 01/19/2022 with Dr. [name]. Today is a regular follow up visit.  Reactive airway disease without complication Past history - Persistent coughing with episodes of wheezing and shortness of breath since October 2021. Recent flare after traveling to [place] requiring Z-Pak, prednisone.  Recently started on Singulair and albuterol as needed with good benefit.  No prior asthma diagnosis.  2022 spirometry showed some restriction with 3% improvement in FEV1 post bronchodilator treatment.  Clinically feeling unchanged. Interim history - had Covid-19 in September, back on Flovent 160mg 1 puff once a day. Today's spirometry was normal.  Daily controller medication(s): stop Flovent completely  If you notice worsening symptoms then go back up to 2 puffs once a day and let uKoreaknow.  During upper respiratory infections/breathing flares:  Start Flovent 1124m 2 puffs twice a day for 1-2 weeks until your breathing symptoms return to baseline.  Pretreat with albuterol 2 puffs. May use albuterol rescue inhaler 2 puffs every 4 to 6 hours as needed for shortness of breath, chest tightness, coughing, and wheezing. May use albuterol rescue inhaler 2 puffs 5 to 15 minutes prior to strenuous physical activities. Monitor frequency of use.  Get spirometry at next visit. Letter written to allow medications with him while traveling.   Seasonal and perennial allergic rhinitis Past history - Rhinitis symptoms  may in the spring and fall.  Takes Clarinex daily with good benefit. 2022 skin testing showed: Positive to dust mites, grass, mold, and cockroach. Interim history - controlled with Clarinex and Singulair. Continue environmental control measures as below. Continue Clarinex daily. Continue Singulair (montelukast) '10mg'$  daily.  May use Ryaltris (olopatadine + mometasone nasal spray combination) 1-2 sprays per nostril twice a day.  Use before going to bed.  This replaces your other nasal sprays. If this works well for you, then have [facility] ship the medication to your home - prescription already sent in.   Change of voice Improved. Recommend ENT evaluation if worsening.  [name].    Gastroesophageal reflux disease Past history - Had EGD this summer requiring dilatation. Complained of dysphagia. Reviewed GI notes - had candida esophagitis. Interim history - noted dysphagia symptoms again. Continue lifestyle and dietary modifications. Take omeprazole '20mg'$  daily in the morning. Nothing to eat or drink for 30 minutes. Follow up with GI.    Return in about 4 months (around 05/20/2022).  Assessment and Plan: PhRohuns a [age].o. male with: No problem-specific Assessment & Plan notes found for this encounter.  No follow-ups on file.  No orders of the defined types were placed in this encounter.  Lab Orders  No laboratory test(s) ordered today    Diagnostics: Spirometry:  Tracings reviewed. His effort: {Blank single:19197::"Good reproducible efforts.","It was hard to get consistent efforts and there is a question as to whether this reflects a maximal maneuver.","Poor effort, data can not be interpreted."} FVC: ***L FEV1: ***L, ***% predicted FEV1/FVC ratio: ***% Interpretation: {Blank single:19197::"Spirometry consistent  with mild obstructive disease","Spirometry consistent with moderate obstructive disease","Spirometry consistent with severe obstructive  disease","Spirometry consistent with possible restrictive disease","Spirometry consistent with mixed obstructive and restrictive disease","Spirometry uninterpretable due to technique","Spirometry consistent with normal pattern","No overt abnormalities noted given today's efforts"}.  Please see scanned spirometry results for details.  Skin Testing: {Blank single:19197::"Select foods","Environmental allergy panel","Environmental allergy panel and select foods","Food allergy panel","None","Deferred due to recent antihistamines use"}. *** Results discussed with patient/family.   Medication List:  Current Outpatient Medications  Medication Sig Dispense Refill  . albuterol (VENTOLIN HFA) 108 (90 Base) MCG/ACT inhaler Inhale 2 puffs into the lungs every 6 (six) hours as needed.    Marland Kitchen aspirin EC 81 MG tablet Take 81 mg by mouth daily.    . Cholecalciferol (VITAMIN D PO) Take 1 tablet by mouth daily.    Marland Kitchen desloratadine (CLARINEX) 5 MG tablet TAKE 1 TABLET (5 MG TOTAL) BY MOUTH DAILY. 90 tablet 1  . empagliflozin (JARDIANCE) 25 MG TABS tablet 10 mg.    . ezetimibe (ZETIA) 10 MG tablet Take 1 tablet (10 mg total) by mouth daily. 90 tablet 0  . finasteride (PROSCAR) 5 MG tablet Take 5 mg by mouth daily.    . fluticasone (FLOVENT HFA) 110 MCG/ACT inhaler Inhale 2 puffs into the lungs daily. WITH SPACER AND RINSE MOUTH AFTERWARDS. 12 g 1  . ibuprofen (ADVIL,MOTRIN) 200 MG tablet Take 600 mg by mouth every 6 (six) hours as needed.    . montelukast (SINGULAIR) 10 MG tablet Take 1 tablet (10 mg total) by mouth daily. 30 tablet 5  . Multiple Vitamin (MULTIVITAMIN) tablet Take 1 tablet by mouth daily.    [name] (RYALTRIS) [id number] MCG/ACT SUSP Place 1-2 sprays into the nose in the morning and at bedtime. 29 g 5  . Omega-3 Fatty Acids (FISH OIL PO) Take 2 tablets by mouth daily.     Marland Kitchen omeprazole (PRILOSEC) 20 MG capsule Take 1 capsule (20 mg total) by mouth daily. 30 capsule 1  . rosuvastatin  (CRESTOR) 20 MG tablet TAKE 1 TABLET BY MOUTH EVERY DAY 90 tablet 3  . Sodium Chloride, Hypertonic, (SOCHLOR OP) Apply to eye as needed.    . tamsulosin (FLOMAX) 0.4 MG CAPS capsule Take 1 capsule (0.4 mg total) by mouth daily after supper. 30 capsule 0  . vitamin E 400 UNIT capsule Take 400 Units by mouth daily.     No current facility-administered medications for this visit.   Allergies: Allergies  Allergen Reactions  . Budesonide-Formoterol Fumarate     Other reaction(s): candida  . Penicillins Hives    Has patient had a PCN reaction causing immediate rash, facial/tongue/throat swelling, SOB or lightheadedness with hypotension: no Has patient had a PCN reaction causing severe rash involving mucus membranes or skin necrosis: yes Has patient had a PCN reaction that required hospitalization: no Has patient had a PCN reaction occurring within the last 10 years: no If all of the above answers are "NO", then may proceed with Cephalosporin use.   . Trimox [Amoxicillin] Hives   I reviewed his past medical history, social history, family history, and environmental history and no significant changes have been reported from his previous visit.  Review of Systems  Constitutional:  Negative for appetite change, chills, fever and unexpected weight change.  HENT:  Positive for congestion and voice change. Negative for rhinorrhea.   Eyes:  Negative for itching.  Respiratory:  Positive for cough. Negative for chest tightness, shortness of breath and wheezing.   Cardiovascular:  Negative for chest pain.  Gastrointestinal:  Negative for abdominal pain.  Skin:  Negative for rash.  Allergic/Immunologic: Positive for environmental allergies. Negative for food allergies.  Neurological:  Negative for headaches.   Objective: There were no vitals taken for this visit. There is no height or weight on file to calculate BMI. Physical Exam Vitals and nursing note reviewed.  Constitutional:       Appearance: Normal appearance. He is well-developed.  HENT:     Head: Normocephalic and atraumatic.     Right Ear: Tympanic membrane and external ear normal.     Left Ear: Tympanic membrane and external ear normal.     Nose: Nose normal.     Mouth/Throat:     Mouth: Mucous membranes are moist.     Pharynx: Oropharynx is clear.     Comments: Mild cobblestoning of pharynx. Eyes:     Conjunctiva/sclera: Conjunctivae normal.  Cardiovascular:     Rate and Rhythm: Normal rate and regular rhythm.     Heart sounds: Normal heart sounds. No murmur heard.    No friction rub. No gallop.  Pulmonary:     Effort: Pulmonary effort is normal.     Breath sounds: Normal breath sounds. No wheezing, rhonchi or rales.  Musculoskeletal:     Cervical back: Neck supple.  Skin:    General: Skin is warm.     Findings: No rash.  Neurological:     Mental Status: He is alert and oriented to person, place, and time.  Psychiatric:        Behavior: Behavior normal.  Previous notes and tests were reviewed. The plan was reviewed with the patient/family, and all questions/concerned were addressed.  It was my pleasure to see Finnean today and participate in his care. Please feel free to contact me with any questions or concerns.  Sincerely,  Rexene Alberts, DO Allergy & Immunology  [facility] office: [place] office: [phone]

## 2022-05-20 ENCOUNTER — Other Ambulatory Visit: Payer: Self-pay

## 2022-05-20 ENCOUNTER — Encounter: Payer: Self-pay | Admitting: Allergy

## 2022-05-20 ENCOUNTER — Ambulatory Visit (INDEPENDENT_AMBULATORY_CARE_PROVIDER_SITE_OTHER): Payer: BC Managed Care – PPO | Admitting: Allergy

## 2022-05-20 ENCOUNTER — Other Ambulatory Visit: Payer: Self-pay | Admitting: Allergy

## 2022-05-20 VITALS — BP 134/80 | HR 81 | Temp 98.2°F | Resp 16 | Ht 68.5 in | Wt 237.1 lb

## 2022-05-20 DIAGNOSIS — J4530 Mild persistent asthma, uncomplicated: Secondary | ICD-10-CM

## 2022-05-20 DIAGNOSIS — J3089 Other allergic rhinitis: Secondary | ICD-10-CM | POA: Diagnosis not present

## 2022-05-20 DIAGNOSIS — J302 Other seasonal allergic rhinitis: Secondary | ICD-10-CM

## 2022-05-20 DIAGNOSIS — K219 Gastro-esophageal reflux disease without esophagitis: Secondary | ICD-10-CM | POA: Diagnosis not present

## 2022-05-20 MED ORDER — ALBUTEROL SULFATE HFA 108 (90 BASE) MCG/ACT IN AERS
2.0000 | INHALATION_SPRAY | RESPIRATORY_TRACT | 1 refills | Status: AC | PRN
Start: 2022-05-20 — End: ?

## 2022-05-20 MED ORDER — FLUTICASONE PROPIONATE HFA 110 MCG/ACT IN AERO: 2.0000 | INHALATION_SPRAY | Freq: Every day | RESPIRATORY_TRACT | 3 refills | Status: DC

## 2022-05-20 NOTE — Assessment & Plan Note (Signed)
Past history - Had EGD this summer requiring dilatation. Complained of dysphagia. Reviewed GI notes - had candida esophagitis. Interim history - did not take omeprazole. Denies symptoms.  Continue lifestyle and dietary modifications. Follow up with GI.

## 2022-05-20 NOTE — Assessment & Plan Note (Addendum)
Past history - Persistent coughing with episodes of wheezing and shortness of breath since October 2021. Recent flare after traveling to [place] requiring Z-Pak, prednisone.  Recently started on Singulair and albuterol as needed with good benefit.  No prior asthma diagnosis.  2022 spirometry showed some restriction with 3% improvement in FEV1 post bronchodilator treatment.  Clinically feeling unchanged. Interim history - unable to wean off Flovent, symptoms flared while traveling to [place] and now noticing wheezing only at night. No prednisone and did not use albuterol for this.  Today's spirometry was normal but not as good as before. Try to take albuterol 2 puffs before going to bed for the next few nights and see if the wheezing goes away. If wheezing goes away then use Flovent 122mg 2 puffs twice a day for 2 weeks. If wheezing does NOT go away - Take omeprazole '20mg'$  daily in the morning. Nothing to eat or drink for 30 minutes. Daily controller medication(s): take Flovent 1173m 2 puffs once a day at night with spacer and rinse mouth afterwards. If Flovent is not covered, check the pricing for the following: Alvesco, Armonair, Arnuity, Asmanex, Pulmicort, Qvar. During respiratory infections/breathing flares:  Start Flovent 11053m2 puffs twice a day for 1-2 weeks until your breathing symptoms return to baseline.  Pretreat with albuterol 2 puffs. May use albuterol rescue inhaler 2 puffs every 4 to 6 hours as needed for shortness of breath, chest tightness, coughing, and wheezing. May use albuterol rescue inhaler 2 puffs 5 to 15 minutes prior to strenuous physical activities. Monitor frequency of use.  Get spirometry at next visit.

## 2022-05-20 NOTE — Patient Instructions (Addendum)
Coughing: Try to take albuterol 2 puffs before going to bed for the next few nights and see if the wheezing goes away. If wheezing goes away then use Flovent 180mg 2 puffs twice a day for 2 weeks. If wheezing does NOT go away - Take omeprazole '20mg'$  daily in the morning. Nothing to eat or drink for 30 minutes. Daily controller medication(s): take Flovent 1142m 2 puffs once a day at night with spacer and rinse mouth afterwards. If Flovent is not covered, check the pricing for the following: Alvesco, Armonair, Arnuity, Asmanex, Pulmicort, Qvar. During respiratory infections/breathing flares:  Start Flovent 11045m2 puffs twice a day for 1-2 weeks until your breathing symptoms return to baseline.  Pretreat with albuterol 2 puffs. May use albuterol rescue inhaler 2 puffs every 4 to 6 hours as needed for shortness of breath, chest tightness, coughing, and wheezing. May use albuterol rescue inhaler 2 puffs 5 to 15 minutes prior to strenuous physical activities. Monitor frequency of use.  Breathing control goals:  Full participation in all desired activities (may need albuterol before activity) Albuterol use two times or less a week on average (not counting use with activity) Cough interfering with sleep two times or less a month Oral steroids no more than once a year No hospitalizations   Environmental allergies 2022 skin testing showed: Positive to dust mites, grass, mold, and cockroach. Continue environmental control measures. Continue Clarinex daily. Continue Singulair (montelukast) '10mg'$  daily.   Heartburn: Continue lifestyle and dietary modifications. Follow up with GI.   Follow up in 3 months or sooner if needed.

## 2022-05-20 NOTE — Assessment & Plan Note (Signed)
Past history - Rhinitis symptoms may in the spring and fall.  Takes Clarinex daily with good benefit. 2022 skin testing showed: Positive to dust mites, grass, mold, and cockroach. Interim history - controlled with Clarinex and Singulair. Ryaltris too drying.  Continue environmental control measures. Continue Clarinex daily. Continue Singulair (montelukast) '10mg'$  daily.

## 2022-05-21 MED ORDER — ARNUITY ELLIPTA 100 MCG/ACT IN AEPB: 1.0000 | INHALATION_SPRAY | Freq: Every day | RESPIRATORY_TRACT | 5 refills | Status: DC

## 2022-05-21 NOTE — Telephone Encounter (Signed)
Pts insurance does not cover fluticasone  (flovent) they prefer arnuity

## 2022-05-21 NOTE — Telephone Encounter (Signed)
Please call patient and let him know that generic Flovent is no longer covered.  I sent in Arnuity 175mg 1 puff once a day to use.  Take twice a day during flares.  Make sure he rinses his mouth or brushes his teeth after each use.  If he gets thrush, let [place].

## 2022-06-19 ENCOUNTER — Other Ambulatory Visit: Payer: Self-pay | Admitting: Cardiology

## 2022-06-26 ENCOUNTER — Ambulatory Visit: Payer: BC Managed Care – PPO | Attending: Cardiology | Admitting: Cardiology

## 2022-06-26 ENCOUNTER — Encounter: Payer: Self-pay | Admitting: Cardiology

## 2022-06-26 VITALS — BP 117/78 | HR 75 | Ht 68.5 in | Wt 232.0 lb

## 2022-06-26 DIAGNOSIS — Z79899 Other long term (current) drug therapy: Secondary | ICD-10-CM | POA: Diagnosis not present

## 2022-06-26 DIAGNOSIS — I2510 Atherosclerotic heart disease of native coronary artery without angina pectoris: Secondary | ICD-10-CM | POA: Diagnosis not present

## 2022-06-26 DIAGNOSIS — E785 Hyperlipidemia, unspecified: Secondary | ICD-10-CM

## 2022-06-26 NOTE — Patient Instructions (Signed)
Medication Instructions:  The current medical regimen is effective;  continue present plan and medications.  *If you need a refill on your cardiac medications before your next appointment, please call your pharmacy*  Lab Work: Please have blood work today (CMP, CBC, Lipid)  If you have labs (blood work) drawn today and your tests are completely normal, you will receive your results only by: MyChart Message (if you have MyChart) OR A paper copy in the mail If you have any lab test that is abnormal or we need to change your treatment, we will call you to review the results.  Follow-Up: At [facility], you and your health needs are our priority.  As part of our continuing mission to provide you with exceptional heart care, we have created designated Provider Care Teams.  These Care Teams include your primary Cardiologist (physician) and Advanced Practice Providers (APPs -  Physician Assistants and Nurse Practitioners) who all work together to provide you with the care you need, when you need it.  We recommend signing up for the patient portal called "MyChart".  Sign up information is provided on this After Visit Summary.  MyChart is used to connect with patients for Virtual Visits (Telemedicine).  Patients are able to view lab/test results, encounter notes, upcoming appointments, etc.  Non-urgent messages can be sent to your provider as well.   To learn more about what you can do with MyChart, go to [email or web address].    Your next appointment:   1 year(s)  Provider:   Donato Schultz, MD

## 2022-06-26 NOTE — Progress Notes (Signed)
Cardiology Office Note:    Date:  06/26/2022   ID:  Kyle Baird, DOB 30-Sep-1955, MRN [id number]  PCP:  Daisy Floro, MD   [facility] Providers Cardiologist:  Donato Schultz, MD     Referring MD: Daisy Floro, MD    History of Present Illness:    Kyle Baird is a 67 y.o. male here for the follow-up of coronary artery disease seen on coronary CT scan, minimal nonobstructive coronary artery disease.  Hyperlipidemia, Crestor was increased and Zetia was added.  Has a strong family history of MI father dying at age 73 with heart attack at age 22 and CABG.  Many years ago in 2017 I saw him.  He is an Acupuncturist at [employer].   He had an episode of syncope/presyncope twice  J&J Covid vaccine, felt tired afterwards.  Was laying on the couch.  He got up from the nap and started to feel sweaty, pale, as though he needed to go to the bathroom, felt as though he was going to faint.  After a few minutes, this resolved.  EMS arrived and he was back to normal.  His blood pressure was low he states.     He had another episode of near syncope in September at a restaurant while eating.  Felt sweaty once again slightly pale but did not faint.     Some of these episodes may have surrounded heavy exertional activity in the yard for instance, could have been dehydrated his wife thinks.     Hezzie Bump [place] went over felt fine. Hydrated well then.  No further episodes of syncope.  2019 weight watchers - 40 pound weight loss.  Unfortunately he put this weight back on during Covid.  He did lose weight previously on Jardiance but he had yeast infection.  He is now on Rybelsus.   Viagra once 6 months. HA.  Be careful with low blood pressure.  Has been battling hip bursitis gets a shot every now and then walks about a mile and a half before it starts to bother him.  He and his wife are celebrating their 30th wedding anniversary and going to [facility] for a few days soon.     Past Medical History:  Diagnosis Date   Adrenal adenoma, right    Asthma    Chronically dry eyes, bilateral    COVID    Diverticulosis    per pt   Fatty liver    per pt liver bx 11-05-2006   Hyperlipidemia    Left ureteral stone    Pre-diabetes    Renal calculus, left    Wears glasses     Past Surgical History:  Procedure Laterality Date   CARDIOVASCULAR STRESS TEST  04-22-2015  dr Anne Fu   Intermediate nuclear study w/ no ischemia and medium defect with moderate severity (consistent with artifact)/  normal wall motion,  stress ef 47%   COLONOSCOPY  2009   CYSTOSCOPY WITH RETROGRADE PYELOGRAM, URETEROSCOPY AND STENT PLACEMENT Left 09/21/2016   Procedure: CYSTOSCOPY WITH RETROGRADE PYELOGRAM, URETEROSCOPY, STONE EXTRACTION  AND STENT PLACEMENT;  Surgeon: Marcine Matar, MD;  Location: [facility];  Service: Urology;  Laterality: Left;   EXCISION SUBCUTANEOUS MASS, POSTERIOR SCALP  11-18-2001  dr Johna Sheriff   lipoma   HOLMIUM LASER APPLICATION Left 09/21/2016   Procedure: HOLMIUM LASER APPLICATION;  Surgeon: Marcine Matar, MD;  Location: [facility];  Service: Urology;  Laterality: Left;   INGUINAL HERNIA REPAIR Bilateral  1997   TONSILLECTOMY  1664   TRANSTHORACIC ECHOCARDIOGRAM  06/03/2015  dr Anne Fu   grade 1 diastolic dysfunction, ef 50-55%/  trivial MR and TR/  atrial septum aneurysm/      Current Medications: Current Meds  Medication Sig   albuterol (VENTOLIN HFA) 108 (90 Base) MCG/ACT inhaler Inhale 2 puffs into the lungs every 4 (four) hours as needed for wheezing or shortness of breath (coughing fits).   aspirin EC 81 MG tablet Take 81 mg by mouth daily.   Cholecalciferol (VITAMIN D PO) Take 1 tablet by mouth daily.   desloratadine (CLARINEX) 5 MG tablet TAKE 1 TABLET (5 MG TOTAL) BY MOUTH DAILY.   ezetimibe (ZETIA) 10 MG tablet Take 1 tablet (10 mg total) by mouth daily.   finasteride (PROSCAR) 5 MG tablet Take 5 mg by mouth daily.    Fluticasone Furoate (ARNUITY ELLIPTA) 100 MCG/ACT AEPB Inhale 1 puff into the lungs daily. Rinse mouth after each use.   ibuprofen (ADVIL,MOTRIN) 200 MG tablet Take 600 mg by mouth every 6 (six) hours as needed.   montelukast (SINGULAIR) 10 MG tablet Take 1 tablet (10 mg total) by mouth daily.   Multiple Vitamin (MULTIVITAMIN) tablet Take 1 tablet by mouth daily.   Omega-3 Fatty Acids (FISH OIL PO) Take 2 tablets by mouth daily.    rosuvastatin (CRESTOR) 20 MG tablet TAKE 1 TABLET BY MOUTH EVERY DAY   Semaglutide 7 MG TABS Take 1 tablet by mouth daily.   Sodium Chloride, Hypertonic, (SOCHLOR OP) Apply to eye as needed.   tamsulosin (FLOMAX) 0.4 MG CAPS capsule Take 1 capsule (0.4 mg total) by mouth daily after supper.   vitamin E 400 UNIT capsule Take 400 Units by mouth daily.     Allergies:   Budesonide-formoterol fumarate, Penicillins, and Trimox [amoxicillin]   Social History   Socioeconomic History   Marital status: Married    Spouse name: Not on file   Number of children: Not on file   Years of education: Not on file   Highest education level: Not on file  Occupational History   Not on file  Tobacco Use   Smoking status: Never   Smokeless tobacco: Former    Types: Snuff    Quit date: 09/12/1983  Vaping Use   Vaping Use: Never used  Substance and Sexual Activity   Alcohol use: Yes    Comment: RARE   Drug use: No   Sexual activity: Not on file  Other Topics Concern   Not on file  Social History Narrative   Not on file   Social Determinants of Health   Financial Resource Strain: Not on file  Food Insecurity: Not on file  Transportation Needs: Not on file  Physical Activity: Not on file  Stress: Not on file  Social Connections: Not on file     Family History: The patient's family history includes Cirrhosis in his mother; Diabetes Mellitus II in his father and mother; Healthy in his daughter, sister, and son; Heart attack (age of onset: 49) in his father; Liver  disease in his father and mother; Pulmonary embolism in his brother.  ROS:   Please see the history of present illness.    No fevers chills nausea vomiting syncope bleeding all other systems reviewed and are negative.  EKGs/Labs/Other Studies Reviewed:    The following studies were reviewed today:  2022: Coronary calcium score of 149. This was 61 percentile for age and  sex matched control.   There are non  obstructive calcified plaques in the LAD and RCA.  Recommend goal directed medical therapy.        EKG:  EKG is  ordered today.  NSR 75 no changes prior sinus rhythm 75 bpm no ischemic changes  Recent Labs: No results found for requested labs within last 365 days.  Recent Lipid Panel    Component Value Date/Time   CHOL 115 10/18/2020 0816   TRIG 124 10/18/2020 0816   HDL 37 (L) 10/18/2020 0816   CHOLHDL 3.1 10/18/2020 0816   LDLCALC 56 10/18/2020 0816     Risk Assessment/Calculations:              Physical Exam:    VS:  BP 117/78 (BP Location: Left Arm, Patient Position: Sitting, Cuff Size: Normal)   Pulse 75   Ht 5' 8.5" (1.74 m)   Wt 232 lb (105.2 kg)   BMI 34.76 kg/m     Wt Readings from Last 3 Encounters:  06/26/22 232 lb (105.2 kg)  05/20/22 237 lb 1.6 oz (107.5 kg)  09/22/21 225 lb (102.1 kg)     GEN: Well nourished, well developed, in no acute distress HEENT: normal Neck: no JVD, carotid bruits, or masses Cardiac: RRR; no murmurs, rubs, or gallops,no edema  Respiratory:  clear to auscultation bilaterally, normal work of breathing GI: soft, nontender, nondistended, + BS MS: no deformity or atrophy Skin: warm and dry, no rash Neuro:  Alert and Oriented x 3, Strength and sensation are intact Psych: euthymic mood, full affect   ASSESSMENT:    1. Coronary artery disease involving native coronary artery of native heart without angina pectoris   2. Hyperlipidemia, unspecified hyperlipidemia type   3. Medication management     PLAN:    In  order of problems listed above:  Coronary artery disease involving native coronary artery of native heart without angina pectoris Nonobstructive plaque noted on coronary CT.  Continue with goal-directed medical therapy.  Aspirin 81 mg, Crestor 20 mg, Zetia 10 mg.  Overall doing well without any anginal symptoms.  Continue with goal-directed medical therapy and medical management.  Checking labs.  Hyperlipidemia Medications as above.  No myalgias.  Crestor Zetia.  Checking labs.  Reactive airway disease without complication Takes Flovent now.  Doing very well.  He states that he stopped his montelukast, was given him some urinary retention he states.  Continuing to monitor this.  Obesity Has been up and down. GLP-1 agonist will help.      Medication Adjustments/Labs and Tests Ordered: Current medicines are reviewed at length with the patient today.  Concerns regarding medicines are outlined above.  Orders Placed This Encounter  Procedures   Comprehensive metabolic panel   CBC   Lipid panel   EKG 12-Lead   No orders of the defined types were placed in this encounter.   Patient Instructions  Medication Instructions:  The current medical regimen is effective;  continue present plan and medications.  *If you need a refill on your cardiac medications before your next appointment, please call your pharmacy*  Lab Work: Please have blood work today (CMP, CBC, Lipid)  If you have labs (blood work) drawn today and your tests are completely normal, you will receive your results only by: MyChart Message (if you have MyChart) OR A paper copy in the mail If you have any lab test that is abnormal or we need to change your treatment, we will call you to review the results.  Follow-Up: At [facility],  you and your health needs are our priority.  As part of our continuing mission to provide you with exceptional heart care, we have created designated Provider Care Teams.  These Care  Teams include your primary Cardiologist (physician) and Advanced Practice Providers (APPs -  Physician Assistants and Nurse Practitioners) who all work together to provide you with the care you need, when you need it.  We recommend signing up for the patient portal called "MyChart".  Sign up information is provided on this After Visit Summary.  MyChart is used to connect with patients for Virtual Visits (Telemedicine).  Patients are able to view lab/test results, encounter notes, upcoming appointments, etc.  Non-urgent messages can be sent to your provider as well.   To learn more about what you can do with MyChart, go to [email or web address].    Your next appointment:   1 year(s)  Provider:   Donato Schultz, MD        Signed, Donato Schultz, MD  06/26/2022 8:42 AM    [facility]

## 2022-06-27 LAB — CBC
Hematocrit: 47.5 % (ref 37.5–51.0)
Hemoglobin: 16.1 g/dL (ref 13.0–17.7)
MCH: 32 pg (ref 26.6–33.0)
MCHC: 33.9 g/dL (ref 31.5–35.7)
MCV: 94 fL (ref 79–97)
Platelets: 182 10*3/uL (ref 150–450)
RBC: 5.03 x10E6/uL (ref 4.14–5.80)
RDW: 12.7 % (ref 11.6–15.4)
WBC: 5.5 10*3/uL (ref 3.4–10.8)

## 2022-06-27 LAB — COMPREHENSIVE METABOLIC PANEL
ALT: 38 IU/L (ref 0–44)
AST: 25 IU/L (ref 0–40)
Albumin/Globulin Ratio: 2.2 (ref 1.2–2.2)
Albumin: 4.6 g/dL (ref 3.9–4.9)
Alkaline Phosphatase: 66 IU/L (ref 44–121)
BUN/Creatinine Ratio: 12 (ref 10–24)
BUN: 11 mg/dL (ref 8–27)
Bilirubin Total: 0.4 mg/dL (ref 0.0–1.2)
CO2: 22 mmol/L (ref 20–29)
Calcium: 9.7 mg/dL (ref 8.6–10.2)
Chloride: 108 mmol/L — ABNORMAL HIGH (ref 96–106)
Creatinine, Ser: 0.9 mg/dL (ref 0.76–1.27)
Globulin, Total: 2.1 g/dL (ref 1.5–4.5)
Glucose: 110 mg/dL — ABNORMAL HIGH (ref 70–99)
Potassium: 4.4 mmol/L (ref 3.5–5.2)
Sodium: 143 mmol/L (ref 134–144)
Total Protein: 6.7 g/dL (ref 6.0–8.5)
eGFR: 95 mL/min/{1.73_m2} (ref >59)

## 2022-06-27 LAB — LIPID PANEL
Chol/HDL Ratio: 3.1 ratio (ref 0.0–5.0)
Cholesterol, Total: 115 mg/dL (ref 100–199)
HDL: 37 mg/dL — ABNORMAL LOW (ref >39)
LDL Chol Calc (NIH): 56 mg/dL (ref 0–99)
Triglycerides: 123 mg/dL (ref 0–149)
VLDL Cholesterol Cal: 22 mg/dL (ref 5–40)

## 2022-06-28 ENCOUNTER — Other Ambulatory Visit: Payer: Self-pay | Admitting: Cardiology

## 2022-07-03 DIAGNOSIS — J018 Other acute sinusitis: Secondary | ICD-10-CM | POA: Diagnosis not present

## 2022-07-03 DIAGNOSIS — J189 Pneumonia, unspecified organism: Secondary | ICD-10-CM | POA: Diagnosis not present

## 2022-07-07 DIAGNOSIS — K115 Sialolithiasis: Secondary | ICD-10-CM | POA: Diagnosis not present

## 2022-07-13 ENCOUNTER — Other Ambulatory Visit: Payer: Self-pay | Admitting: Cardiology

## 2022-07-19 ENCOUNTER — Other Ambulatory Visit: Payer: Self-pay | Admitting: Allergy

## 2022-08-03 DIAGNOSIS — Z0000 Encounter for general adult medical examination without abnormal findings: Secondary | ICD-10-CM | POA: Diagnosis not present

## 2022-09-01 NOTE — Progress Notes (Unsigned)
Follow Up Note  RE: Kyle Baird MRN: [id number] DOB: 01-30-1956 Date of Office Visit: 09/02/2022  Referring provider: Daisy Floro, MD Primary care provider: Daisy Floro, MD  Chief Complaint: No chief complaint on file.  History of Present Illness: I had the pleasure of seeing Kyle Baird for a follow up visit at the [facility] on 09/01/2022. He is a 67 y.o. male, who is being followed for reactive airway disease, allergic rhinitis and GERD. His previous allergy office visit was on 05/20/2022 with Dr. Selena Batten. Today is a regular follow up visit.  Reactive airway disease without complication Past history - Persistent coughing with episodes of wheezing and shortness of breath since October 2021. Recent flare after traveling to [place] requiring Z-Pak, prednisone.  Recently started on Singulair and albuterol as needed with good benefit.  No prior asthma diagnosis.  2022 spirometry showed some restriction with 3% improvement in FEV1 post bronchodilator treatment.  Clinically feeling unchanged. Interim history - unable to wean off Flovent, symptoms flared while traveling to [place] and now noticing wheezing only at night. No prednisone and did not use albuterol for this.  Today's spirometry was normal but not as good as before. Try to take albuterol 2 puffs before going to bed for the next few nights and see if the wheezing goes away. If wheezing goes away then use Flovent 2 puffs twice a day for 2 weeks. If wheezing does NOT go away - Take omeprazole 20mg  daily in the morning. Nothing to eat or drink for 30 minutes. Daily controller medication(s): take Flovent 2 puffs once a day at night with spacer and rinse mouth afterwards. If Flovent is not covered, check the pricing for the following: Alvesco, Armonair, Arnuity, Asmanex, Pulmicort, Qvar. During respiratory infections/breathing flares:  Start Flovent 2 puffs twice a day for 1-2 weeks  until your breathing symptoms return to baseline.  Pretreat with albuterol 2 puffs. May use albuterol rescue inhaler 2 puffs every 4 to 6 hours as needed for shortness of breath, chest tightness, coughing, and wheezing. May use albuterol rescue inhaler 2 puffs 5 to 15 minutes prior to strenuous physical activities. Monitor frequency of use.  Get spirometry at next visit.   Seasonal and perennial allergic rhinitis Past history - Rhinitis symptoms may in the spring and fall.  Takes Clarinex daily with good benefit. 2022 skin testing showed: Positive to dust mites, grass, mold, and cockroach. Interim history - controlled with Clarinex and Singulair. Ryaltris too drying.  Continue environmental control measures. Continue Clarinex daily. Continue Singulair (montelukast) 10mg  daily.    Gastroesophageal reflux disease Past history - Had EGD this summer requiring dilatation. Complained of dysphagia. Reviewed GI notes - had candida esophagitis. Interim history - did not take omeprazole. Denies symptoms.  Continue lifestyle and dietary modifications. Follow up with GI.    Return in about 3 months (around 08/20/2022).  Assessment and Plan: Kyle Baird is a 67 y.o. male with: No problem-specific Assessment & Plan notes found for this encounter.  No follow-ups on file.  No orders of the defined types were placed in this encounter.  Lab Orders  No laboratory test(s) ordered today    Diagnostics: Spirometry:  Tracings reviewed. His effort: {Blank single:19197::"Good reproducible efforts.","It was hard to get consistent efforts and there is a question as to whether this reflects a maximal maneuver.","Poor effort, data can not be interpreted."} FVC: ***L FEV1: ***L, ***% predicted FEV1/FVC ratio: ***% Interpretation: {Blank single:19197::"Spirometry consistent  with mild obstructive disease","Spirometry consistent with moderate obstructive disease","Spirometry consistent with severe obstructive  disease","Spirometry consistent with possible restrictive disease","Spirometry consistent with mixed obstructive and restrictive disease","Spirometry uninterpretable due to technique","Spirometry consistent with normal pattern","No overt abnormalities noted given today's efforts"}.  Please see scanned spirometry results for details.  Skin Testing: {Blank single:19197::"Select foods","Environmental allergy panel","Environmental allergy panel and select foods","Food allergy panel","None","Deferred due to recent antihistamines use"}. *** Results discussed with patient/family.   Medication List:  Current Outpatient Medications  Medication Sig Dispense Refill   albuterol (VENTOLIN HFA) 108 (90 Base) MCG/ACT inhaler Inhale 2 puffs into the lungs every 4 (four) hours as needed for wheezing or shortness of breath (coughing fits). 18 g 1   aspirin EC 81 MG tablet Take 81 mg by mouth daily.     Cholecalciferol (VITAMIN D PO) Take 1 tablet by mouth daily.     desloratadine (CLARINEX) 5 MG tablet TAKE 1 TABLET (5 MG TOTAL) BY MOUTH DAILY. 90 tablet 0   ezetimibe (ZETIA) 10 MG tablet TAKE 1 TABLET BY MOUTH EVERY DAY 90 tablet 3   finasteride (PROSCAR) 5 MG tablet Take 5 mg by mouth daily.     Fluticasone Furoate (ARNUITY ELLIPTA) 100 MCG/ACT AEPB Inhale 1 puff into the lungs daily. Rinse mouth after each use. 30 each 5   ibuprofen (ADVIL,MOTRIN) 200 MG tablet Take 600 mg by mouth every 6 (six) hours as needed.     montelukast (SINGULAIR) 10 MG tablet Take 1 tablet (10 mg total) by mouth daily. 30 tablet 5   Multiple Vitamin (MULTIVITAMIN) tablet Take 1 tablet by mouth daily.     Omega-3 Fatty Acids (FISH OIL PO) Take 2 tablets by mouth daily.      rosuvastatin (CRESTOR) 20 MG tablet TAKE 1 TABLET BY MOUTH EVERY DAY 90 tablet 3   Semaglutide 7 MG TABS Take 1 tablet by mouth daily.     Sodium Chloride, Hypertonic, (SOCHLOR OP) Apply to eye as needed.     tamsulosin (FLOMAX) 0.4 MG CAPS capsule Take 1  capsule (0.4 mg total) by mouth daily after supper. 30 capsule 0   vitamin E 400 UNIT capsule Take 400 Units by mouth daily.     No current facility-administered medications for this visit.   Allergies: Allergies  Allergen Reactions   Budesonide-Formoterol Fumarate     Other reaction(s): candida   Penicillins Hives    Has patient had a PCN reaction causing immediate rash, facial/tongue/throat swelling, SOB or lightheadedness with hypotension: no Has patient had a PCN reaction causing severe rash involving mucus membranes or skin necrosis: yes Has patient had a PCN reaction that required hospitalization: no Has patient had a PCN reaction occurring within the last 10 years: no If all of the above answers are "NO", then may proceed with Cephalosporin use.    Trimox [Amoxicillin] Hives   I reviewed his past medical history, social history, family history, and environmental history and no significant changes have been reported from his previous visit.  Review of Systems  Constitutional:  Negative for appetite change, chills, fever and unexpected weight change.  HENT:  Negative for congestion, rhinorrhea and voice change.   Eyes:  Negative for itching.  Respiratory:  Positive for cough and wheezing. Negative for chest tightness and shortness of breath.   Cardiovascular:  Negative for chest pain.  Gastrointestinal:  Negative for abdominal pain.  Skin:  Negative for rash.  Allergic/Immunologic: Positive for environmental allergies. Negative for food allergies.  Neurological:  Negative for headaches.  Objective: There were no vitals taken for this visit. There is no height or weight on file to calculate BMI. Physical Exam Vitals and nursing note reviewed.  Constitutional:      Appearance: Normal appearance. He is well-developed.  HENT:     Head: Normocephalic and atraumatic.     Right Ear: Tympanic membrane and external ear normal.     Left Ear: Tympanic membrane and external ear  normal.     Nose: Nose normal.     Mouth/Throat:     Mouth: Mucous membranes are moist.     Pharynx: Oropharynx is clear.  Eyes:     Conjunctiva/sclera: Conjunctivae normal.  Cardiovascular:     Rate and Rhythm: Normal rate and regular rhythm.     Heart sounds: Normal heart sounds. No murmur heard. Pulmonary:     Effort: Pulmonary effort is normal.     Breath sounds: Normal breath sounds. No wheezing, rhonchi or rales.  Musculoskeletal:     Cervical back: Neck supple.  Skin:    General: Skin is warm.     Findings: No rash.  Neurological:     Mental Status: He is alert and oriented to person, place, and time.  Psychiatric:        Behavior: Behavior normal.    Previous notes and tests were reviewed. The plan was reviewed with the patient/family, and all questions/concerned were addressed.  It was my pleasure to see Kyle Baird today and participate in his care. Please feel free to contact me with any questions or concerns.  Sincerely,  Wyline Mood, DO Allergy & Immunology  [facility] office: [phone] [facility] office: [phone]

## 2022-09-02 ENCOUNTER — Ambulatory Visit (INDEPENDENT_AMBULATORY_CARE_PROVIDER_SITE_OTHER): Payer: BC Managed Care – PPO | Admitting: Allergy

## 2022-09-02 ENCOUNTER — Other Ambulatory Visit: Payer: Self-pay

## 2022-09-02 ENCOUNTER — Encounter: Payer: Self-pay | Admitting: Allergy

## 2022-09-02 VITALS — BP 118/74 | HR 89 | Temp 98.3°F | Ht 68.5 in | Wt 237.6 lb

## 2022-09-02 DIAGNOSIS — J302 Other seasonal allergic rhinitis: Secondary | ICD-10-CM | POA: Diagnosis not present

## 2022-09-02 DIAGNOSIS — J4530 Mild persistent asthma, uncomplicated: Secondary | ICD-10-CM | POA: Diagnosis not present

## 2022-09-02 DIAGNOSIS — K219 Gastro-esophageal reflux disease without esophagitis: Secondary | ICD-10-CM | POA: Diagnosis not present

## 2022-09-02 DIAGNOSIS — J3089 Other allergic rhinitis: Secondary | ICD-10-CM

## 2022-09-02 MED ORDER — ARNUITY ELLIPTA 100 MCG/ACT IN AEPB: 1.0000 | INHALATION_SPRAY | Freq: Every day | RESPIRATORY_TRACT | 5 refills | Status: DC

## 2022-09-02 NOTE — Patient Instructions (Addendum)
Coughing: Daily controller medication(s): Arnuity 1 puff once a day and rinse mouth after each use.   During respiratory infections/breathing flares:  Start Arnuity 1 puff twice a day for 1-2 weeks until your breathing symptoms return to baseline.  Pretreat with albuterol 2 puffs. May use albuterol rescue inhaler 2 puffs every 4 to 6 hours as needed for shortness of breath, chest tightness, coughing, and wheezing. May use albuterol rescue inhaler 2 puffs 5 to 15 minutes prior to strenuous physical activities. Monitor frequency of use.  Breathing control goals:  Full participation in all desired activities (may need albuterol before activity) Albuterol use two times or less a week on average (not counting use with activity) Cough interfering with sleep two times or less a month Oral steroids no more than once a year No hospitalizations   Environmental allergies 2022 skin testing showed: Positive to dust mites, grass, mold, and cockroach. Continue environmental control measures. Continue Clarinex daily. Try to stop Singulair and if you notice issues then restart.   Heartburn: Continue lifestyle and dietary modifications.  Follow up in 4 months or sooner if needed.

## 2022-09-03 ENCOUNTER — Encounter: Payer: Self-pay | Admitting: Allergy

## 2022-09-03 NOTE — Assessment & Plan Note (Signed)
Past history - Had EGD this summer requiring dilatation. Complained of dysphagia. Reviewed GI notes - had candida esophagitis. Interim history - Denies symptoms.  Continue lifestyle and dietary modifications.

## 2022-09-03 NOTE — Assessment & Plan Note (Signed)
Past history - Rhinitis symptoms may in the spring and fall.  Takes Clarinex daily with good benefit. 2022 skin testing showed: Positive to dust mites, grass, mold, and cockroach. Ryaltris too drying.  Interim history - controlled with Clarinex and Singulair.  Continue environmental control measures. Continue Clarinex daily. Try to stop Singulair and if you notice issues then restart.

## 2022-09-03 NOTE — Assessment & Plan Note (Signed)
Past history - Persistent coughing with episodes of wheezing and shortness of breath since October 2021. Recent flare after traveling to [place] requiring Z-Pak, prednisone.  Recently started on Singulair and albuterol as needed with good benefit.  No prior asthma diagnosis.  2022 spirometry showed some restriction with 3% improvement in FEV1 post bronchodilator treatment.  Clinically feeling unchanged. Interim history - flared with pneumonia (treated with abx and prednisone). Otherwise doing well with Arnuity.  Today's spirometry was normal. Daily controller medication(s): Arnuity 1 puff once a day and rinse mouth after each use.   During respiratory infections/breathing flares:  Start Arnuity 1 puff twice a day for 1-2 weeks until your breathing symptoms return to baseline.  Pretreat with albuterol 2 puffs. May use albuterol rescue inhaler 2 puffs every 4 to 6 hours as needed for shortness of breath, chest tightness, coughing, and wheezing. May use albuterol rescue inhaler 2 puffs 5 to 15 minutes prior to strenuous physical activities. Monitor frequency of use.  Get spirometry at next visit.

## 2022-09-23 DIAGNOSIS — L299 Pruritus, unspecified: Secondary | ICD-10-CM | POA: Diagnosis not present

## 2022-09-23 DIAGNOSIS — W57XXXA Bitten or stung by nonvenomous insect and other nonvenomous arthropods, initial encounter: Secondary | ICD-10-CM | POA: Diagnosis not present

## 2022-09-28 DIAGNOSIS — M79671 Pain in right foot: Secondary | ICD-10-CM | POA: Diagnosis not present

## 2022-09-28 DIAGNOSIS — M79672 Pain in left foot: Secondary | ICD-10-CM | POA: Diagnosis not present

## 2022-09-28 DIAGNOSIS — L600 Ingrowing nail: Secondary | ICD-10-CM | POA: Diagnosis not present

## 2022-10-12 DIAGNOSIS — L600 Ingrowing nail: Secondary | ICD-10-CM | POA: Diagnosis not present

## 2022-10-12 DIAGNOSIS — L609 Nail disorder, unspecified: Secondary | ICD-10-CM | POA: Diagnosis not present

## 2022-10-12 DIAGNOSIS — M79674 Pain in right toe(s): Secondary | ICD-10-CM | POA: Diagnosis not present

## 2022-10-12 DIAGNOSIS — M79675 Pain in left toe(s): Secondary | ICD-10-CM | POA: Diagnosis not present

## 2022-10-12 DIAGNOSIS — B351 Tinea unguium: Secondary | ICD-10-CM | POA: Diagnosis not present

## 2022-10-16 ENCOUNTER — Other Ambulatory Visit: Payer: Self-pay | Admitting: Allergy

## 2022-10-26 DIAGNOSIS — H25813 Combined forms of age-related cataract, bilateral: Secondary | ICD-10-CM | POA: Diagnosis not present

## 2022-10-26 DIAGNOSIS — H524 Presbyopia: Secondary | ICD-10-CM | POA: Diagnosis not present

## 2022-10-26 DIAGNOSIS — H11153 Pinguecula, bilateral: Secondary | ICD-10-CM | POA: Diagnosis not present

## 2022-10-26 DIAGNOSIS — E119 Type 2 diabetes mellitus without complications: Secondary | ICD-10-CM | POA: Diagnosis not present

## 2022-10-26 DIAGNOSIS — H04123 Dry eye syndrome of bilateral lacrimal glands: Secondary | ICD-10-CM | POA: Diagnosis not present

## 2022-11-04 DIAGNOSIS — Z79899 Other long term (current) drug therapy: Secondary | ICD-10-CM | POA: Diagnosis not present

## 2022-11-09 DIAGNOSIS — M79675 Pain in left toe(s): Secondary | ICD-10-CM | POA: Diagnosis not present

## 2022-11-09 DIAGNOSIS — B351 Tinea unguium: Secondary | ICD-10-CM | POA: Diagnosis not present

## 2022-11-09 DIAGNOSIS — L600 Ingrowing nail: Secondary | ICD-10-CM | POA: Diagnosis not present

## 2022-11-09 DIAGNOSIS — M79674 Pain in right toe(s): Secondary | ICD-10-CM | POA: Diagnosis not present

## 2022-12-04 DIAGNOSIS — Z79899 Other long term (current) drug therapy: Secondary | ICD-10-CM | POA: Diagnosis not present

## 2022-12-07 DIAGNOSIS — B351 Tinea unguium: Secondary | ICD-10-CM | POA: Diagnosis not present

## 2022-12-07 DIAGNOSIS — M79674 Pain in right toe(s): Secondary | ICD-10-CM | POA: Diagnosis not present

## 2022-12-07 DIAGNOSIS — M79675 Pain in left toe(s): Secondary | ICD-10-CM | POA: Diagnosis not present

## 2022-12-07 DIAGNOSIS — L600 Ingrowing nail: Secondary | ICD-10-CM | POA: Diagnosis not present

## 2022-12-30 ENCOUNTER — Other Ambulatory Visit: Payer: Self-pay

## 2022-12-30 DIAGNOSIS — R112 Nausea with vomiting, unspecified: Secondary | ICD-10-CM | POA: Diagnosis not present

## 2022-12-30 DIAGNOSIS — R109 Unspecified abdominal pain: Secondary | ICD-10-CM | POA: Diagnosis not present

## 2022-12-30 DIAGNOSIS — R0602 Shortness of breath: Secondary | ICD-10-CM | POA: Insufficient documentation

## 2022-12-30 DIAGNOSIS — K5900 Constipation, unspecified: Secondary | ICD-10-CM | POA: Insufficient documentation

## 2022-12-30 DIAGNOSIS — R079 Chest pain, unspecified: Secondary | ICD-10-CM | POA: Diagnosis not present

## 2022-12-30 DIAGNOSIS — Z5321 Procedure and treatment not carried out due to patient leaving prior to being seen by health care provider: Secondary | ICD-10-CM | POA: Insufficient documentation

## 2022-12-30 LAB — COMPREHENSIVE METABOLIC PANEL WITH GFR
ALT: 44 U/L (ref 0–44)
AST: 25 U/L (ref 15–41)
Albumin: 4.7 g/dL (ref 3.5–5.0)
Alkaline Phosphatase: 57 U/L (ref 38–126)
Anion gap: 8 (ref 5–15)
BUN: 12 mg/dL (ref 8–23)
CO2: 23 mmol/L (ref 22–32)
Calcium: 9.4 mg/dL (ref 8.9–10.3)
Chloride: 107 mmol/L (ref 98–111)
Creatinine, Ser: 0.97 mg/dL (ref 0.61–1.24)
GFR, Estimated: >60 mL/min (ref >60)
Glucose, Bld: 141 mg/dL — ABNORMAL HIGH (ref 70–99)
Potassium: 3.9 mmol/L (ref 3.5–5.1)
Sodium: 138 mmol/L (ref 135–145)
Total Bilirubin: 0.4 mg/dL (ref 0.3–1.2)
Total Protein: 7.3 g/dL (ref 6.5–8.1)

## 2022-12-30 LAB — URINALYSIS, ROUTINE W REFLEX MICROSCOPIC
Bilirubin Urine: NEGATIVE
Glucose, UA: NEGATIVE mg/dL
Hgb urine dipstick: NEGATIVE
Ketones, ur: NEGATIVE mg/dL
Leukocytes,Ua: NEGATIVE
Nitrite: NEGATIVE
Protein, ur: NEGATIVE mg/dL
Specific Gravity, Urine: 1.017 (ref 1.005–1.030)
pH: 6 (ref 5.0–8.0)

## 2022-12-30 LAB — CBC
HCT: 45 % (ref 39.0–52.0)
Hemoglobin: 15.8 g/dL (ref 13.0–17.0)
MCH: 32.4 pg (ref 26.0–34.0)
MCHC: 35.1 g/dL (ref 30.0–36.0)
MCV: 92.2 fL (ref 80.0–100.0)
Platelets: 186 K/uL (ref 150–400)
RBC: 4.88 MIL/uL (ref 4.22–5.81)
RDW: 12.3 % (ref 11.5–15.5)
WBC: 8.5 K/uL (ref 4.0–10.5)
nRBC: 0 % (ref 0.0–0.2)

## 2022-12-30 LAB — LIPASE, BLOOD: Lipase: 46 U/L (ref 11–51)

## 2022-12-30 NOTE — ED Triage Notes (Signed)
Lower abd pain pain. Started this AM. Semaglutide has been causing constipation-unsure if related. Last BM Monday night. -n/-v. -CP.-SOB.

## 2022-12-31 ENCOUNTER — Emergency Department (HOSPITAL_BASED_OUTPATIENT_CLINIC_OR_DEPARTMENT_OTHER)
Admission: EM | Admit: 2022-12-31 | Discharge: 2022-12-31 | Discharge status: Against Medical Advice | Payer: BC Managed Care – PPO | Attending: Emergency Medicine | Admitting: Emergency Medicine

## 2022-12-31 NOTE — ED Notes (Signed)
Pt stated he went to the bathroom and feels better. Requested to have IV taken out so he can go home.

## 2023-01-04 DIAGNOSIS — B351 Tinea unguium: Secondary | ICD-10-CM | POA: Diagnosis not present

## 2023-01-04 DIAGNOSIS — M79674 Pain in right toe(s): Secondary | ICD-10-CM | POA: Diagnosis not present

## 2023-01-04 DIAGNOSIS — M79675 Pain in left toe(s): Secondary | ICD-10-CM | POA: Diagnosis not present

## 2023-01-04 DIAGNOSIS — L600 Ingrowing nail: Secondary | ICD-10-CM | POA: Diagnosis not present

## 2023-01-05 DIAGNOSIS — K5900 Constipation, unspecified: Secondary | ICD-10-CM | POA: Diagnosis not present

## 2023-01-05 DIAGNOSIS — B351 Tinea unguium: Secondary | ICD-10-CM | POA: Diagnosis not present

## 2023-01-05 DIAGNOSIS — Z23 Encounter for immunization: Secondary | ICD-10-CM | POA: Diagnosis not present

## 2023-01-05 DIAGNOSIS — E1169 Type 2 diabetes mellitus with other specified complication: Secondary | ICD-10-CM | POA: Diagnosis not present

## 2023-01-06 ENCOUNTER — Ambulatory Visit: Payer: BC Managed Care – PPO | Admitting: Allergy

## 2023-01-12 ENCOUNTER — Other Ambulatory Visit: Payer: Self-pay | Admitting: Allergy

## 2023-02-03 ENCOUNTER — Ambulatory Visit: Payer: BC Managed Care – PPO | Admitting: Allergy

## 2023-02-03 DIAGNOSIS — Z0000 Encounter for general adult medical examination without abnormal findings: Secondary | ICD-10-CM | POA: Diagnosis not present

## 2023-02-03 DIAGNOSIS — E1169 Type 2 diabetes mellitus with other specified complication: Secondary | ICD-10-CM | POA: Diagnosis not present

## 2023-02-03 DIAGNOSIS — Z125 Encounter for screening for malignant neoplasm of prostate: Secondary | ICD-10-CM | POA: Diagnosis not present

## 2023-02-08 DIAGNOSIS — Z0000 Encounter for general adult medical examination without abnormal findings: Secondary | ICD-10-CM | POA: Diagnosis not present

## 2023-02-08 DIAGNOSIS — E1169 Type 2 diabetes mellitus with other specified complication: Secondary | ICD-10-CM | POA: Diagnosis not present

## 2023-02-08 DIAGNOSIS — B351 Tinea unguium: Secondary | ICD-10-CM | POA: Diagnosis not present

## 2023-02-17 ENCOUNTER — Encounter: Payer: Self-pay | Admitting: Allergy

## 2023-02-17 ENCOUNTER — Other Ambulatory Visit: Payer: Self-pay

## 2023-02-17 ENCOUNTER — Ambulatory Visit: Payer: BC Managed Care – PPO | Admitting: Allergy

## 2023-02-17 VITALS — BP 110/86 | HR 92 | Temp 97.9°F | Resp 20 | Ht 68.5 in | Wt 238.0 lb

## 2023-02-17 DIAGNOSIS — J302 Other seasonal allergic rhinitis: Secondary | ICD-10-CM

## 2023-02-17 DIAGNOSIS — J4520 Mild intermittent asthma, uncomplicated: Secondary | ICD-10-CM

## 2023-02-17 DIAGNOSIS — J3089 Other allergic rhinitis: Secondary | ICD-10-CM

## 2023-02-17 DIAGNOSIS — K219 Gastro-esophageal reflux disease without esophagitis: Secondary | ICD-10-CM

## 2023-02-17 MED ORDER — MONTELUKAST SODIUM 10 MG PO TABS: 10.0000 mg | ORAL_TABLET | Freq: Every day | ORAL | 3 refills | Status: DC

## 2023-02-17 MED ORDER — DESLORATADINE 5 MG PO TABS: 5.0000 mg | ORAL_TABLET | Freq: Every day | ORAL | 3 refills | Status: DC

## 2023-02-17 NOTE — Progress Notes (Signed)
Follow Up Note  RE: Kyle Baird MRN: [id number] DOB: 10/22/1955 Date of Office Visit: 02/17/2023  Referring provider: Daisy Floro, MD Primary care provider: Daisy Floro, MD  Chief Complaint: Asthma  History of Present Illness: I had the pleasure of seeing Kyle Baird for a follow up visit at the [facility] on 02/17/2023. He is a 67 y.o. male, who is being followed for asthma, allergic rhinitis, GERD. His previous allergy office visit was on 09/02/2022 with Dr. Selena Batten. Today is a regular follow up visit.  Discussed the use of AI scribe software for clinical note transcription with the patient, who gave verbal consent to proceed.  The patient, with a history of asthma and allergies, presents for a follow-up visit after six months. He reports a recent self-discontinuation of his steroid inhaler, Arnuity, a week prior to the visit. Since stopping the medication, he has noticed some chest congestion, but is unsure if it is related to the discontinuation of the inhaler. He has not needed to use his rescue inhaler, nor has he required any emergency care or prednisone for his asthma.  In addition to the asthma, the patient has been managing his allergies with daily Clarinex and montelukast, which he reports as effective. He has not attempted to discontinue the montelukast.  The patient also mentions a recent change in his medication regimen, having started on Jardiance. He reports a weight loss of about seven pounds since starting the medication and overall good tolerance. He has been on this medication for a couple of weeks at the time of the visit.  The patient's asthma and allergies did not seem to be significantly affected by environmental factors such as pollen in the spring and fall. He was able to mow the lawn over the summer without any noticeable exacerbation of his symptoms.      Assessment and Plan: Kyle Baird is a 67 y.o. male with: Asthma  Past  history - Persistent coughing with episodes of wheezing and shortness of breath since October 2021. Recent flare after traveling to [place] requiring Z-Pak, prednisone.  Recently started on Singulair and albuterol as needed with good benefit.  No prior asthma diagnosis.  2022 spirometry showed some restriction with 3% improvement in FEV1 post bronchodilator treatment.  Clinically feeling unchanged. Interim history - Stable with no recent exacerbations. Patient has self-discontinued Arnudine for one week with no significant change in symptoms.  Today's spirometry was normal. Daily controller medication(s): stop Arnuity.  If you notice worsening symptoms then restart Arnuity 1 puff once a day and rinse mouth after each use.   During respiratory infections/breathing flares:  Start Arnuity 1 puff twice a day for 1-2 weeks until your breathing symptoms return to baseline.  Pretreat with albuterol 2 puffs. May use albuterol rescue inhaler 2 puffs every 4 to 6 hours as needed for shortness of breath, chest tightness, coughing, and wheezing. May use albuterol rescue inhaler 2 puffs 5 to 15 minutes prior to strenuous physical activities. Monitor frequency of use - if you need to use it more than twice per week on a consistent basis let us know.  Get spirometry at next visit.   Seasonal and perennial allergic rhinitis Past history - Rhinitis symptoms may in the spring and fall.  Takes Clarinex daily with good benefit. 2022 skin testing showed: Positive to dust mites, grass, mold, and cockroach. Ryaltris too drying.  Interim history - controlled with Clarinex and Singulair. Didn't try to stop  singulair.  Continue environmental control measures. Continue Clarinex daily. Continue Singulair (montelukast) 10mg  daily at night. Consider allergy injections for long term control if above medications do not help the symptoms - handout given.    Gastroesophageal reflux disease Past history - Had EGD  this summer requiring dilatation. Complained of dysphagia. Reviewed GI notes - had candida esophagitis. Continue lifestyle and dietary modifications.  Return in about 6 months (around 08/18/2023).  Meds ordered this encounter  Medications   desloratadine (CLARINEX) 5 MG tablet    Sig: Take 1 tablet (5 mg total) by mouth daily.    Dispense:  90 tablet    Refill:  3   montelukast (SINGULAIR) 10 MG tablet    Sig: Take 1 tablet (10 mg total) by mouth daily.    Dispense:  90 tablet    Refill:  3   Lab Orders  No laboratory test(s) ordered today    Diagnostics: Spirometry:  Tracings reviewed. His effort: Good reproducible efforts. FVC: 3.40L FEV1: 2.61L, 83% predicted FEV1/FVC ratio: 77% Interpretation: Spirometry consistent with normal pattern.  Please see scanned spirometry results for details.  Medication List:  Current Outpatient Medications  Medication Sig Dispense Refill   aspirin EC 81 MG tablet Take 81 mg by mouth daily.     Cholecalciferol (VITAMIN D PO) Take 1 tablet by mouth daily.     Coenzyme Q10 (CO Q 10) 100 MG CAPS 1 capsule with a meal Orally Once a day for 30 days     ezetimibe (ZETIA) 10 MG tablet TAKE 1 TABLET BY MOUTH EVERY DAY 90 tablet 3   finasteride (PROSCAR) 5 MG tablet Take 5 mg by mouth daily.     ibuprofen (ADVIL,MOTRIN) 200 MG tablet Take 600 mg by mouth every 6 (six) hours as needed.     MOUNJARO 5 MG/0.5ML Pen 0.5 ML Subcutaneous once a week     Multiple Vitamin (MULTIVITAMIN) tablet Take 1 tablet by mouth daily.     Omega-3 Fatty Acids (FISH OIL PO) Take 2 tablets by mouth daily.      rosuvastatin (CRESTOR) 20 MG tablet TAKE 1 TABLET BY MOUTH EVERY DAY 90 tablet 3   Sodium Chloride, Hypertonic, (SOCHLOR OP) Apply to eye as needed.     tamsulosin (FLOMAX) 0.4 MG CAPS capsule Take 1 capsule (0.4 mg total) by mouth daily after supper. 30 capsule 0   vitamin E 400 UNIT capsule Take 400 Units by mouth daily.     albuterol (VENTOLIN HFA) 108 (90 Base)  MCG/ACT inhaler Inhale 2 puffs into the lungs every 4 (four) hours as needed for wheezing or shortness of breath (coughing fits). (Patient not taking: Reported on 09/02/2022) 18 g 1   desloratadine (CLARINEX) 5 MG tablet Take 1 tablet (5 mg total) by mouth daily. 90 tablet 3   montelukast (SINGULAIR) 10 MG tablet Take 1 tablet (10 mg total) by mouth daily. 90 tablet 3   No current facility-administered medications for this visit.   Allergies: Allergies  Allergen Reactions   Budesonide-Formoterol Fumarate     Other reaction(s): candida   Penicillins Hives    Has patient had a PCN reaction causing immediate rash, facial/tongue/throat swelling, SOB or lightheadedness with hypotension: no Has patient had a PCN reaction causing severe rash involving mucus membranes or skin necrosis: yes Has patient had a PCN reaction that required hospitalization: no Has patient had a PCN reaction occurring within the last 10 years: no If all of the above answers are "NO", then may  proceed with Cephalosporin use.    Trimox [Amoxicillin] Hives   I reviewed his past medical history, social history, family history, and environmental history and no significant changes have been reported from his previous visit.  Review of Systems  Constitutional:  Negative for appetite change, chills, fever and unexpected weight change.  HENT:  Negative for congestion, rhinorrhea and voice change.   Eyes:  Negative for itching.  Respiratory:  Negative for cough, chest tightness, shortness of breath and wheezing.   Cardiovascular:  Negative for chest pain.  Gastrointestinal:  Negative for abdominal pain.  Skin:  Negative for rash.  Allergic/Immunologic: Positive for environmental allergies. Negative for food allergies.  Neurological:  Negative for headaches.    Objective: BP 110/86 (BP Location: Left Arm, Patient Position: Sitting, Cuff Size: Large)   Pulse 92   Temp 97.9 F (36.6 C)   Resp 20   Ht 5' 8.5" (1.74 m)   Wt  238 lb (108 kg)   SpO2 96%   BMI 35.66 kg/m  Body mass index is 35.66 kg/m. Physical Exam Vitals and nursing note reviewed.  Constitutional:      Appearance: Normal appearance. He is well-developed.  HENT:     Head: Normocephalic and atraumatic.     Right Ear: Tympanic membrane and external ear normal.     Left Ear: Tympanic membrane and external ear normal.     Nose: Nose normal.     Mouth/Throat:     Mouth: Mucous membranes are moist.     Pharynx: Oropharynx is clear.  Eyes:     Conjunctiva/sclera: Conjunctivae normal.  Cardiovascular:     Rate and Rhythm: Normal rate and regular rhythm.     Heart sounds: Normal heart sounds. No murmur heard. Pulmonary:     Effort: Pulmonary effort is normal.     Breath sounds: Normal breath sounds. No wheezing, rhonchi or rales.  Musculoskeletal:     Cervical back: Neck supple.  Skin:    General: Skin is warm.     Findings: No rash.  Neurological:     Mental Status: He is alert and oriented to person, place, and time.  Psychiatric:        Behavior: Behavior normal.   Previous notes and tests were reviewed. The plan was reviewed with the patient/family, and all questions/concerned were addressed.  It was my pleasure to see Kyle Baird today and participate in his care. Please feel free to contact me with any questions or concerns.  Sincerely,  Wyline Mood, DO Allergy & Immunology  [facility] office: [phone] [facility] office: [phone]

## 2023-02-17 NOTE — Patient Instructions (Addendum)
Breathing Normal breathing test today. Daily controller medication(s): stop Arnuity.  If you notice worsening symptoms then restart Arnuity 1 puff once a day and rinse mouth after each use.   During respiratory infections/breathing flares:  Start Arnuity 1 puff twice a day for 1-2 weeks until your breathing symptoms return to baseline.  Pretreat with albuterol 2 puffs. May use albuterol rescue inhaler 2 puffs every 4 to 6 hours as needed for shortness of breath, chest tightness, coughing, and wheezing. May use albuterol rescue inhaler 2 puffs 5 to 15 minutes prior to strenuous physical activities. Monitor frequency of use - if you need to use it more than twice per week on a consistent basis let us know.  Breathing control goals:  Full participation in all desired activities (may need albuterol before activity) Albuterol use two times or less a week on average (not counting use with activity) Cough interfering with sleep two times or less a month Oral steroids no more than once a year No hospitalizations   Environmental allergies 2022 skin testing positive to dust mites, grass, mold, and cockroach. Continue environmental control measures. Continue Clarinex daily. Continue Singulair (montelukast) 10mg  daily at night. Consider allergy injections for long term control if above medications do not help the symptoms - handout given.   Heartburn Continue lifestyle and dietary modifications.  Follow up in 6 months or sooner if needed.

## 2023-02-23 DIAGNOSIS — L814 Other melanin hyperpigmentation: Secondary | ICD-10-CM | POA: Diagnosis not present

## 2023-02-23 DIAGNOSIS — L2089 Other atopic dermatitis: Secondary | ICD-10-CM | POA: Diagnosis not present

## 2023-02-23 DIAGNOSIS — D225 Melanocytic nevi of trunk: Secondary | ICD-10-CM | POA: Diagnosis not present

## 2023-02-23 DIAGNOSIS — L821 Other seborrheic keratosis: Secondary | ICD-10-CM | POA: Diagnosis not present

## 2023-04-02 DIAGNOSIS — N401 Enlarged prostate with lower urinary tract symptoms: Secondary | ICD-10-CM | POA: Diagnosis not present

## 2023-04-02 DIAGNOSIS — N5201 Erectile dysfunction due to arterial insufficiency: Secondary | ICD-10-CM | POA: Diagnosis not present

## 2023-04-02 DIAGNOSIS — R350 Frequency of micturition: Secondary | ICD-10-CM | POA: Diagnosis not present

## 2023-04-17 ENCOUNTER — Other Ambulatory Visit: Payer: Self-pay | Admitting: Cardiology

## 2023-06-21 ENCOUNTER — Ambulatory Visit: Payer: BC Managed Care – PPO | Attending: Cardiology | Admitting: Cardiology

## 2023-06-21 ENCOUNTER — Encounter: Payer: Self-pay | Admitting: Cardiology

## 2023-06-21 VITALS — BP 144/92 | HR 83 | Ht 68.5 in | Wt 237.8 lb

## 2023-06-21 DIAGNOSIS — E785 Hyperlipidemia, unspecified: Secondary | ICD-10-CM

## 2023-06-21 DIAGNOSIS — I2510 Atherosclerotic heart disease of native coronary artery without angina pectoris: Secondary | ICD-10-CM | POA: Diagnosis not present

## 2023-06-21 DIAGNOSIS — R0609 Other forms of dyspnea: Secondary | ICD-10-CM

## 2023-06-21 DIAGNOSIS — E119 Type 2 diabetes mellitus without complications: Secondary | ICD-10-CM

## 2023-06-21 MED ORDER — ROSUVASTATIN CALCIUM 20 MG PO TABS: 20.0000 mg | ORAL_TABLET | Freq: Every day | ORAL | 3 refills | Status: DC

## 2023-06-21 MED ORDER — EZETIMIBE 10 MG PO TABS: 10.0000 mg | ORAL_TABLET | Freq: Every day | ORAL | 3 refills | Status: AC

## 2023-06-21 NOTE — Patient Instructions (Signed)
 Medication Instructions:  The current medical regimen is effective;  continue present plan and medications.  *If you need a refill on your cardiac medications before your next appointment, please call your pharmacy*  Testing/Procedures: Your physician has requested that you have an echocardiogram. Echocardiography is a painless test that uses sound waves to create images of your heart. It provides your doctor with information about the size and shape of your heart and how well your heart's chambers and valves are working. This procedure takes approximately one hour. There are no restrictions for this procedure. Please do NOT wear cologne, perfume, aftershave, or lotions (deodorant is allowed). Please arrive 15 minutes prior to your appointment time.  Please note: We ask at that you not bring children with you during ultrasound (echo/ vascular) testing. Due to room size and safety concerns, children are not allowed in the ultrasound rooms during exams. Our front office staff cannot provide observation of children in our lobby area while testing is being conducted. An adult accompanying a patient to their appointment will only be allowed in the ultrasound room at the discretion of the ultrasound technician under special circumstances. We apologize for any inconvenience.   Follow-Up: At [facility], you and your health needs are our priority.  As part of our continuing mission to provide you with exceptional heart care, our providers are all part of one team.  This team includes your primary Cardiologist (physician) and Advanced Practice Providers or APPs (Physician Assistants and Nurse Practitioners) who all work together to provide you with the care you need, when you need it.  Your next appointment:   1 year(s)  Provider:   Donato Schultz, MD    We recommend signing up for the patient portal called "MyChart".  Sign up information is provided on this After Visit Summary.  MyChart is used  to connect with patients for Virtual Visits (Telemedicine).  Patients are able to view lab/test results, encounter notes, upcoming appointments, etc.  Non-urgent messages can be sent to your provider as well.   To learn more about what you can do with MyChart, go to [email or web address].      1st Floor: - Lobby - Registration  - Pharmacy  - Lab - Cafe  2nd Floor: - PV Lab - Diagnostic Testing (echo, CT, nuclear med)  3rd Floor: - Vacant  4th Floor: - TCTS (cardiothoracic surgery) - AFib Clinic - Structural Heart Clinic - Vascular Surgery  - Vascular Ultrasound  5th Floor: - HeartCare Cardiology (general and EP) - Clinical Pharmacy for coumadin, hypertension, lipid, weight-loss medications, and med management appointments    Valet parking services will be available as well.

## 2023-06-21 NOTE — Progress Notes (Signed)
 Cardiology Office Note:  .   Date:  06/21/2023  ID:  Kyle Baird, DOB 08/06/1955, MRN [id number] PCP: Daisy Floro, MD  [facility] Providers Cardiologist:  Donato Schultz, MD     History of Present Illness: .   Kyle Baird is a 68 y.o. male Discussed the use of AI scribe software for clinical note transcription with the patient, who gave verbal consent to proceed.  History of Present Illness Kyle Baird is a 68 year old male with coronary artery disease who presents for follow-up.  He has coronary artery disease with minimal nonobstructive plaque noted on a CT scan in 2022 and a coronary calcium score of 149. He is on medical management with aspirin 81 mg daily, Zetia 10 mg daily, and rosuvastatin 20 mg daily, aiming for an LDL goal of less than 70. He has a strong family history of myocardial infarction; his father died at age 20 after a heart attack at 47, and his brother, who is a year older, had a heart attack in November. No myalgias are reported, but he has experienced stiffness in the past with Lipitor, which resolved after discontinuation.  He has experienced episodes of syncope, including one vasovagal episode at a restaurant, possibly related to dehydration.  He reports occasional shortness of breath and is concerned about whether it is due to being out of shape. He recalls an ultrasound of his heart was done 6-7 years ago. He describes a previous CT scan with contrast as causing 'a weird feeling.'  He occasionally uses an inhaler for reactive airways disease.      Studies Reviewed: Marland Kitchen   EKG Interpretation Date/Time:  Monday June 21 2023 13:24:12 EDT Ventricular Rate:  83 PR Interval:  166 QRS Duration:  80 QT Interval:  362 QTC Calculation: 425 R Axis:   63  Text Interpretation: Normal sinus rhythm Normal ECG When compared with ECG of 21-Sep-2016 11:26, No significant change was found Confirmed by Donato Schultz ([id number]) on 06/21/2023 1:26:09 PM     Results RADIOLOGY Coronary calcium score: 149 (2022) CT scan: Minimal nonobstructive plaque (2022)  DIAGNOSTIC EKG: Normal (06/21/2023) Risk Assessment/Calculations:           Physical Exam:   VS:  BP (!) 144/92   Pulse 83   Ht 5' 8.5" (1.74 m)   Wt 237 lb 12.8 oz (107.9 kg)   SpO2 98%   BMI 35.63 kg/m    Wt Readings from Last 3 Encounters:  06/21/23 237 lb 12.8 oz (107.9 kg)  02/17/23 238 lb (108 kg)  12/30/22 230 lb (104.3 kg)    GEN: Well nourished, well developed in no acute distress NECK: No JVD; No carotid bruits CARDIAC: RRR, no murmurs, no rubs, no gallops RESPIRATORY:  Clear to auscultation without rales, wheezing or rhonchi  ABDOMEN: Soft, non-tender, non-distended EXTREMITIES:  No edema; No deformity   ASSESSMENT AND PLAN: .    Assessment and Plan Assessment & Plan Coronary artery disease Coronary artery disease with minimal nonobstructive plaque on CT scan in 2022 and a coronary calcium score of 149. Strong family history of myocardial infarction. Currently on rosuvastatin and ezetimibe for LDL goal of <70. No myalgias reported. Discussed potential muscle stiffness from rosuvastatin and possible medication holiday. CoQ10 and fish oil discussed, with no proven benefit in reducing myocardial infarction risk. - Initiate a two-week holiday from rosuvastatin to assess for muscle stiffness. - Continue ezetimibe 10 mg daily. - Discontinue CoQ10 and fish oil. -  Encourage exercise and adherence to a Mediterranean diet. - Consider referral to lipid clinic if symptoms persist after medication holiday.  Shortness of breath Intermittent dyspnea, possibly related to deconditioning. Previous echocardiogram 6-7 years ago. Differential includes cardiac causes, but irregular presentation suggests other etiologies. Discussed cardiac function assessment to rule out cardiac causes. - Order echocardiogram to assess cardiac function and rule out cardiac causes of dyspnea.   1  yr f/u        Signed, Donato Schultz, MD

## 2023-07-27 ENCOUNTER — Ambulatory Visit (HOSPITAL_COMMUNITY): Attending: Cardiovascular Disease

## 2023-07-27 DIAGNOSIS — R0609 Other forms of dyspnea: Secondary | ICD-10-CM | POA: Diagnosis present

## 2023-07-27 LAB — ECHOCARDIOGRAM COMPLETE
AR max vel: 2.93 cm2
AV Area VTI: 2.69 cm2
AV Area mean vel: 2.82 cm2
AV Mean grad: 4.5 mmHg
AV Peak grad: 7.7 mmHg
Ao pk vel: 1.39 m/s
Area-P 1/2: 3.61 cm2
S' Lateral: 2.45 cm

## 2023-07-29 ENCOUNTER — Ambulatory Visit: Payer: Self-pay | Admitting: Cardiology

## 2023-08-04 ENCOUNTER — Other Ambulatory Visit: Payer: Self-pay | Admitting: Allergy

## 2023-08-04 NOTE — Telephone Encounter (Signed)
 Refill for Arnuity 100 mcg x 1 [facility] refills [facility]. Patient is due for an OV next month.

## 2023-08-18 ENCOUNTER — Ambulatory Visit (INDEPENDENT_AMBULATORY_CARE_PROVIDER_SITE_OTHER): Payer: BC Managed Care – PPO | Admitting: Allergy

## 2023-08-18 ENCOUNTER — Encounter: Payer: Self-pay | Admitting: Allergy

## 2023-08-18 DIAGNOSIS — J302 Other seasonal allergic rhinitis: Secondary | ICD-10-CM

## 2023-08-18 DIAGNOSIS — J3089 Other allergic rhinitis: Secondary | ICD-10-CM | POA: Diagnosis not present

## 2023-08-18 DIAGNOSIS — K219 Gastro-esophageal reflux disease without esophagitis: Secondary | ICD-10-CM | POA: Diagnosis not present

## 2023-08-18 DIAGNOSIS — J4520 Mild intermittent asthma, uncomplicated: Secondary | ICD-10-CM | POA: Diagnosis not present

## 2023-08-18 NOTE — Patient Instructions (Addendum)
 Breathing Normal breathing test today. Daily controller medication(s): continue Arnuity 100mcg 1 puff once a day and rinse mouth after each use.   During respiratory infections/breathing flares:  Start Arnuity 100mcg 1 puff twice a day for 1-2 weeks until your breathing symptoms return to baseline.  Pretreat with albuterol  2 puffs. May use albuterol  rescue inhaler 2 puffs every 4 to 6 hours as needed for shortness of breath, chest tightness, coughing, and wheezing. May use albuterol  rescue inhaler 2 puffs 5 to 15 minutes prior to strenuous physical activities. Monitor frequency of use - if you need to use it more than twice per week on a consistent basis let us  know.  Breathing control goals:  Full participation in all desired activities (may need albuterol  before activity) Albuterol  use two times or less a week on average (not counting use with activity) Cough interfering with sleep two times or less a month Oral steroids no more than once a year No hospitalizations   Environmental allergies 2022 skin testing positive to dust mites, grass, mold, and cockroach. Continue environmental control measures. Continue Clarinex  daily. Continue Singulair  (montelukast ) 10mg  daily at night. Consider allergy injections for long term control if above medications do not help the symptoms.   Heartburn Continue lifestyle and dietary modifications. May take over the counter famotidine (pepcid) 20mg  once a day as needed.   Follow up in 6 months or sooner if needed.

## 2023-08-18 NOTE — Progress Notes (Signed)
 Follow Up Note  RE: Kyle Baird MRN: [id number] DOB: 1955-09-22 Date of Office Visit: 08/18/2023  Referring provider: Jimmey Mould, MD Primary care provider: Jimmey Mould, MD  Chief Complaint: Seasonal and perennial allergic rhinitis, Mild persistent reactive airway disease, and Follow-up (He reported, he had sinus infection few weeks ago. He is doing well. No concerns)  History of Present Illness: I had the pleasure of seeing Clary Meeker for a follow up visit at the [facility] on 08/18/2023. He is a 68 y.o. male, who is being followed for asthma, allergic rhinitis and GERD. His previous allergy office visit was on 02/17/2023 with Dr. Burdette Carolin. Today is a regular follow up visit.  Discussed the use of AI scribe software for clinical note transcription with the patient, who gave verbal consent to proceed.    He has been using Arnuity once daily, which effectively manages his asthma symptoms. Attempts to discontinue the medication twice resulted in a return of congestion after four to five days, prompting resumption. He is concerned about the cost of Arnuity under his new Medicare plan, which requires a $50 monthly payment.  Three weeks ago, he experienced a severe cold with symptoms affecting his lungs. He visited urgent care and was prescribed antibiotics, which he completed and subsequently recovered from the cold. He has not required the use of a rescue inhaler during this period.  For allergies, he is taking Clarinex  and Singulair , which are effective despite high pollen levels.  He has noticed an increase in heartburn, particularly after consuming strawberries, but has not taken medication for it. He occasionally uses Tums for relief but did not find it necessary to use them recently.  Regarding prostate health, he has discontinued finasteride due to side effects and is scheduled for a procedure called ITIND on June 24. He reports that finasteride caused  discomfort in his chest.     Assessment and Plan: Lexie is a 68 y.o. male with: Mild intermittent asthma without complication Past history - Persistent coughing with episodes of wheezing and shortness of breath since October 2021. Recent flare after traveling to [place] requiring Z-Pak, prednisone.  Recently started on Singulair  and albuterol  as needed with good benefit.  No prior asthma diagnosis.  2022 spirometry showed some restriction with 3% improvement in FEV1 post bronchodilator treatment.  Clinically feeling unchanged. Interim history - Stable with no recent exacerbations. Tried to stop Arnuity but symptoms returned after a few days.  Today's spirometry was normal. Daily controller medication(s): continue Arnuity 100mcg 1 puff once a day and rinse mouth after each use.   During respiratory infections/breathing flares:  Start Arnuity 100mcg 1 puff twice a day for 1-2 weeks until your breathing symptoms return to baseline.  Pretreat with albuterol  2 puffs. May use albuterol  rescue inhaler 2 puffs every 4 to 6 hours as needed for shortness of breath, chest tightness, coughing, and wheezing. May use albuterol  rescue inhaler 2 puffs 5 to 15 minutes prior to strenuous physical activities. Monitor frequency of use - if you need to use it more than twice per week on a consistent basis let us  know.  Get spirometry at next visit.   Seasonal and perennial allergic rhinitis Past history - 2022 skin testing positive to dust mites, grass, mold, and cockroach. Ryaltris  too drying.  Interim history - controlled with Clarinex  and Singulair .  Continue environmental control measures. Continue Clarinex  daily. Continue Singulair  (montelukast ) 10mg  daily at night. Consider allergy injections for long term  control if above medications do not help the symptoms.    Gastroesophageal reflux disease Past history - Had EGD this summer requiring dilatation. Complained of dysphagia. Reviewed GI notes - had candida  esophagitis. Continue lifestyle and dietary modifications. May take over the counter famotidine (pepcid) 20mg  once a day as needed.   Return in about 6 months (around 02/17/2024).  No orders of the defined types were placed in this encounter.  Lab Orders  No laboratory test(s) ordered today    Diagnostics: Spirometry:  Tracings reviewed. His effort: Good reproducible efforts. FVC: 3.75L FEV1: 2.56L, 81% predicted FEV1/FVC ratio: 68% Interpretation: Spirometry consistent with normal pattern.  Please see scanned spirometry results for details.  Results discussed with patient/family.  Medication List:  Current Outpatient Medications  Medication Sig Dispense Refill   albuterol  (VENTOLIN  HFA) 108 (90 Base) MCG/ACT inhaler Inhale 2 puffs into the lungs every 4 (four) hours as needed for wheezing or shortness of breath (coughing fits). 18 g 1   aspirin EC 81 MG tablet Take 81 mg by mouth daily.     Cholecalciferol (VITAMIN D PO) Take 1 tablet by mouth daily.     desloratadine  (CLARINEX ) 5 MG tablet Take 1 tablet (5 mg total) by mouth daily. 90 tablet 3   ezetimibe  (ZETIA ) 10 MG tablet Take 1 tablet (10 mg total) by mouth daily. 90 tablet 3   Fluticasone  Furoate (ARNUITY ELLIPTA ) 100 MCG/ACT AEPB INHALE 1 PUFF INTO THE LUNGS DAILY. RINSE MOUTH AFTER USE 30 each 0   ibuprofen (ADVIL,MOTRIN) 200 MG tablet Take 600 mg by mouth every 6 (six) hours as needed.     montelukast  (SINGULAIR ) 10 MG tablet Take 1 tablet (10 mg total) by mouth daily. 90 tablet 3   MOUNJARO 5 MG/0.5ML Pen 0.5 ML Subcutaneous once a week     Multiple Vitamin (MULTIVITAMIN) tablet Take 1 tablet by mouth daily.     rosuvastatin  (CRESTOR ) 20 MG tablet Take 1 tablet (20 mg total) by mouth daily. 90 tablet 3   Sodium Chloride, Hypertonic, (SOCHLOR OP) Apply to eye as needed.     tamsulosin  (FLOMAX ) 0.4 MG CAPS capsule Take 1 capsule (0.4 mg total) by mouth daily after supper. 30 capsule 0   terbinafine (LAMISIL) 250 MG  tablet Take 250 mg by mouth as directed. Pt takes 1 tablet on the first week of every month for one full year.     vitamin E 400 UNIT capsule Take 400 Units by mouth daily.     finasteride (PROSCAR) 5 MG tablet Take 5 mg by mouth daily. (Patient not taking: Reported on 08/18/2023)     No current facility-administered medications for this visit.   Allergies: Allergies  Allergen Reactions   Budesonide-Formoterol Fumarate     Other reaction(s): candida   Penicillins Hives    Has patient had a PCN reaction causing immediate rash, facial/tongue/throat swelling, SOB or lightheadedness with hypotension: no Has patient had a PCN reaction causing severe rash involving mucus membranes or skin necrosis: yes Has patient had a PCN reaction that required hospitalization: no Has patient had a PCN reaction occurring within the last 10 years: no If all of the above answers are "NO", then may proceed with Cephalosporin use.    Trimox [Amoxicillin] Hives   I reviewed his past medical history, social history, family history, and environmental history and no significant changes have been reported from his previous visit.  Review of Systems  Constitutional:  Negative for appetite change, chills, fever and unexpected  weight change.  HENT:  Negative for congestion, rhinorrhea and voice change.   Eyes:  Negative for itching.  Respiratory:  Negative for cough, chest tightness, shortness of breath and wheezing.   Cardiovascular:  Negative for chest pain.  Gastrointestinal:  Negative for abdominal pain.  Skin:  Negative for rash.  Allergic/Immunologic: Positive for environmental allergies. Negative for food allergies.  Neurological:  Negative for headaches.    Objective: There were no vitals taken for this visit. There is no height or weight on file to calculate BMI. Physical Exam Vitals and nursing note reviewed.  Constitutional:      Appearance: Normal appearance. He is well-developed.  HENT:     Head:  Normocephalic and atraumatic.     Right Ear: Tympanic membrane and external ear normal.     Left Ear: Tympanic membrane and external ear normal.     Nose: Nose normal.     Mouth/Throat:     Mouth: Mucous membranes are moist.     Pharynx: Oropharynx is clear.  Eyes:     Conjunctiva/sclera: Conjunctivae normal.  Cardiovascular:     Rate and Rhythm: Normal rate and regular rhythm.     Heart sounds: Normal heart sounds. No murmur heard. Pulmonary:     Effort: Pulmonary effort is normal.     Breath sounds: Normal breath sounds. No wheezing, rhonchi or rales.  Musculoskeletal:     Cervical back: Neck supple.  Skin:    General: Skin is warm.     Findings: No rash.  Neurological:     Mental Status: He is alert and oriented to person, place, and time.  Psychiatric:        Behavior: Behavior normal.   Previous notes and tests were reviewed. The plan was reviewed with the patient/family, and all questions/concerned were addressed.  It was my pleasure to see Pellegrino today and participate in his care. Please feel free to contact me with any questions or concerns.  Sincerely,  Eudelia Hero, DO Allergy & Immunology  [facility]  [facility] office: [phone] [facility] office: [phone]

## 2023-09-05 ENCOUNTER — Other Ambulatory Visit: Payer: Self-pay | Admitting: Allergy

## 2023-10-31 ENCOUNTER — Other Ambulatory Visit: Payer: Self-pay | Admitting: Allergy

## 2023-11-01 ENCOUNTER — Other Ambulatory Visit: Payer: Self-pay | Admitting: Allergy

## 2023-11-02 ENCOUNTER — Telehealth: Payer: Self-pay

## 2023-11-02 ENCOUNTER — Other Ambulatory Visit (HOSPITAL_COMMUNITY): Payer: Self-pay

## 2023-11-02 NOTE — Telephone Encounter (Signed)
*  AA  Pharmacy Patient Advocate Encounter   Received notification from RX Request Messages that prior authorization for Arnuity is required/requested.   Insurance verification completed.   The patient is insured through [facility] .   Per test claim: The current 30 day co-pay is, $0.00.  No PA needed at this time. This test claim was processed through [facility]- copay amounts may vary at other pharmacies due to pharmacy/plan contracts, or as the patient moves through the different stages of their insurance plan.     *Brand Arnuity covered for $0.00  L/V for pt pharmacy to bill for Brand name

## 2023-11-03 NOTE — Telephone Encounter (Signed)
 PT called to ask about Phx block on arnuity, reviewed with Rosina who advised that PT should be able to get Brand Arnuity and not the Generic. Told him to call Phx for same convo, call us  back if any further issues, he thanked

## 2023-12-03 ENCOUNTER — Other Ambulatory Visit: Payer: Self-pay | Admitting: Allergy

## 2023-12-06 ENCOUNTER — Other Ambulatory Visit: Payer: Self-pay | Admitting: Allergy

## 2023-12-06 ENCOUNTER — Telehealth: Payer: Self-pay | Admitting: Allergy

## 2023-12-06 MED ORDER — ARNUITY ELLIPTA 100 MCG/ACT IN AEPB: 1.0000 | INHALATION_SPRAY | Freq: Every day | RESPIRATORY_TRACT | 5 refills | Status: DC

## 2023-12-06 MED ORDER — ARNUITY ELLIPTA 100 MCG/ACT IN AEPB: 1.0000 | INHALATION_SPRAY | Freq: Every day | RESPIRATORY_TRACT | 1 refills | Status: DC

## 2023-12-06 NOTE — Telephone Encounter (Signed)
 I called and informed patient about sending in arnuity ellipta  to the pharmacy. Pt said great, was wanting a 6 month supply. Sent in 6 month supply.

## 2023-12-06 NOTE — Telephone Encounter (Signed)
 Pt needs medication Ar[facility]ity Ellipta  [facility] [address]

## 2023-12-06 NOTE — Addendum Note (Signed)
 Addended by: Hellen Shanley E on: 12/06/2023 09:45 AM   Modules accepted: Orders

## 2023-12-07 ENCOUNTER — Telehealth: Payer: Self-pay | Admitting: Cardiology

## 2023-12-07 ENCOUNTER — Other Ambulatory Visit (HOSPITAL_COMMUNITY): Payer: Self-pay

## 2023-12-07 DIAGNOSIS — E7849 Other hyperlipidemia: Secondary | ICD-10-CM

## 2023-12-07 NOTE — Telephone Encounter (Signed)
 Called patient back. Patient complaining of joint pain when taking Crestor , and notice he is not so stiff when he comes off of it for a few weeks. Patient stated Crestor  is not as bad as the lipitor was. Patient would like an alternative. Patient is not interested in seeing Lipid Clinic at this time. Will forward to Dr. Jeffrie and his nurse.

## 2023-12-07 NOTE — Telephone Encounter (Signed)
 Pt c/o medication issue:  1. Name of Medication: rosuvastatin  (CRESTOR ) 20 MG tablet   2. How are you currently taking this medication (dosage and times per day)?    3. Are you having a reaction (difficulty breathing--STAT)? no  4. What is your medication issue? Patient calling to see if he can be on a different type of medication. Its states that its causing joint pain for him. Please advise

## 2023-12-08 MED ORDER — ROSUVASTATIN CALCIUM 10 MG PO TABS: 10.0000 mg | ORAL_TABLET | Freq: Every day | ORAL | 3 refills | Status: AC

## 2023-12-08 NOTE — Telephone Encounter (Addendum)
 Spoke to patient, not ready to go on injectable therapy yet and does not want to see Pharm D at this point. Advised to lower dose to 10 mg daily or even 10 mg every other day. Report me max tolerated dose before follow up lipid lab on 01/14/2024.

## 2024-01-03 ENCOUNTER — Telehealth: Payer: Self-pay | Admitting: Allergy

## 2024-01-03 MED ORDER — ARNUITY ELLIPTA 100 MCG/ACT IN AEPB: 1.0000 | INHALATION_SPRAY | Freq: Every day | RESPIRATORY_TRACT | 5 refills | Status: DC

## 2024-01-03 NOTE — Addendum Note (Signed)
 Addended by: ONEITA CHRISTIANS D on: 01/03/2024 04:47 PM   Modules accepted: Orders

## 2024-01-03 NOTE — Telephone Encounter (Signed)
 Dervin is requesting refills for [facility] [address] [facility] [place].  I told him he had 5 refills and to call the pharmacy but he stated he has tried to call the pharmacy numerous times and he can't get an actual person to speak to and when he does automated, it tells him they dont recognize him having Arnuity.

## 2024-01-03 NOTE — Telephone Encounter (Signed)
 The last Rx that was sent on 9/22 failed to e-prescribe. Spoke with the pharmacy and all that was needed was to resend.   Tried calling patient, no answer. Left voicemail for a return call.

## 2024-01-14 LAB — LIPID PANEL
Chol/HDL Ratio: 3.8 ratio (ref 0.0–5.0)
Cholesterol, Total: 142 mg/dL (ref 100–199)
HDL: 37 mg/dL — ABNORMAL LOW (ref >39)
LDL Chol Calc (NIH): 77 mg/dL (ref 0–99)
Triglycerides: 159 mg/dL — ABNORMAL HIGH (ref 0–149)
VLDL Cholesterol Cal: 28 mg/dL (ref 5–40)

## 2024-01-18 ENCOUNTER — Ambulatory Visit: Payer: Self-pay | Admitting: Pharmacist

## 2024-01-18 NOTE — Telephone Encounter (Signed)
 Lipid lab results discussed over the phone. LDLc and TG slightly above the goal. Patient takes 10 mg Crestor  with Zetia  10 mg daily. Patient wants to try taking Crestor  10 mg alternate with 20 mg daily and will be having lipid lab checked at PCP for annual  physical end of Nov. Will share the result with us .

## 2024-02-09 NOTE — Telephone Encounter (Signed)
 TC 143  TG 105 HDLc 42 LDLc  82 VLDLc 19  while on Crestor  20 mg 4 days a week and 10 mg other 3 days a week along with Zetia  10 mg daily   Patient will be seeing me to discuss other lipid lowering options on Apr 03, 2024 at 2:30

## 2024-02-09 NOTE — Telephone Encounter (Signed)
 Patient does not have result handy for lipid lab will call me back with the detail.

## 2024-02-21 ENCOUNTER — Ambulatory Visit: Admitting: Allergy

## 2024-03-01 ENCOUNTER — Other Ambulatory Visit: Payer: Self-pay

## 2024-03-01 ENCOUNTER — Encounter: Payer: Self-pay | Admitting: Allergy

## 2024-03-01 ENCOUNTER — Ambulatory Visit: Admitting: Allergy

## 2024-03-01 VITALS — BP 122/88 | HR 91 | Temp 98.2°F | Resp 16 | Ht 68.5 in | Wt 239.7 lb

## 2024-03-01 DIAGNOSIS — J4520 Mild intermittent asthma, uncomplicated: Secondary | ICD-10-CM

## 2024-03-01 DIAGNOSIS — J3089 Other allergic rhinitis: Secondary | ICD-10-CM

## 2024-03-01 DIAGNOSIS — J302 Other seasonal allergic rhinitis: Secondary | ICD-10-CM | POA: Diagnosis not present

## 2024-03-01 DIAGNOSIS — K219 Gastro-esophageal reflux disease without esophagitis: Secondary | ICD-10-CM | POA: Diagnosis not present

## 2024-03-01 MED ORDER — ARNUITY ELLIPTA 100 MCG/ACT IN AEPB: 1.0000 | INHALATION_SPRAY | Freq: Every day | RESPIRATORY_TRACT | 11 refills | Status: AC

## 2024-03-01 MED ORDER — MONTELUKAST SODIUM 10 MG PO TABS: 10.0000 mg | ORAL_TABLET | Freq: Every day | ORAL | 3 refills | Status: AC

## 2024-03-01 NOTE — Patient Instructions (Addendum)
 Breathing Normal breathing test today. Daily controller medication(s): continue Arnuity 100mcg 1 puff once a day and rinse mouth after each use.   During respiratory infections/breathing flares:  Start Arnuity 100mcg 1 puff twice a day for 1-2 weeks until your breathing symptoms return to baseline.  Pretreat with albuterol  2 puffs. May use albuterol  rescue inhaler 2 puffs every 4 to 6 hours as needed for shortness of breath, chest tightness, coughing, and wheezing. May use albuterol  rescue inhaler 2 puffs 5 to 15 minutes prior to strenuous physical activities. Monitor frequency of use - if you need to use it more than twice per week on a consistent basis let us  know.  Breathing control goals:  Full participation in all desired activities (may need albuterol  before activity) Albuterol  use two times or less a week on average (not counting use with activity) Cough interfering with sleep two times or less a month Oral steroids no more than once a year No hospitalizations   Environmental allergies 2022 skin testing positive to dust mites, grass, mold, and cockroach. Continue environmental control measures. Continue Clarinex  daily. Singulair  (montelukast ) 10mg  daily at night. You may stop taking this and see how you are feeling. If you notice any worsening asthma/allergy symptoms then okay to restart.  Consider allergy injections for long term control if above medications do not help the symptoms - handout given.   Heartburn Continue lifestyle and dietary modifications. May take over the counter famotidine (pepcid) 20mg  once a day or tums as needed.   Elevated blood pressure  Blood pressure reading was high in our office today. Vitals:   03/01/24 1539  BP: (!) 142/88   Please follow up with PCP regarding this.    Follow up in 6 months or sooner if needed.

## 2024-03-01 NOTE — Progress Notes (Signed)
 Follow Up Note  RE: Kyle Baird MRN: [id number] DOB: Jul 07, 1955 Date of Office Visit: 03/01/2024  Referring provider: Okey Carlin Redbird, MD Primary care provider: Okey Carlin Redbird, MD  Chief Complaint: Follow-up (He says his allergy and asthma are the same. He had an asthma flared up on November- coughing , wheezing at night, and more mucus production. )  History of Present Illness: I had the pleasure of seeing Kyle Baird for a follow up visit at the [facility] on 03/01/2024. He is a 68 y.o. male, who is being followed for asthma, allergic rhinitis, GERD. His previous allergy office visit was on 08/18/2023 with Dr. Luke. Today is a regular follow up visit.  Discussed the use of AI scribe software for clinical note transcription with the patient, who gave verbal consent to proceed.    He experienced an asthma flare in November, similar to a previous episode last year, characterized by lung congestion, phlegm production, and nighttime wheezing. The flare lasted approximately three to four weeks before resolving. During this period, he continued his regular medication regimen of Arnuity once daily and montelukast  but did not use albuterol  or increase the Arnuity dosage as recommended as he forgot. He did not seek emergency care or use prednisone, and there was no concurrent illness such as a cold.  He has experimented with stopping Arnuity in the past, which led to a return of congestion within a week. He also takes Clarinex  daily for allergies, which are currently well-controlled. He is considering stopping montelukast  to assess its impact on his symptoms.  He has a history of prostate procedures in June, which allowed him to discontinue tamsulosin  and finasteride.   He reports occasional heartburn, which he manages with Tums. His blood pressure is usually in the 120s, although it was elevated during this visit. No fever, chills, current cough, or wheezing. No recent  illness such as a cold.     Assessment and Plan: Kyle Baird is a 68 y.o. male with: Mild intermittent asthma without complication Past history - Persistent coughing with episodes of wheezing and shortness of breath since October 2021. 2022 spirometry showed some restriction with 3% improvement in FEV1 post bronchodilator treatment.  Clinically feeling unchanged. Unable to stop Arnuity.  Interim history - Asthma flare in November with congestion, phlegm, and nocturnal wheezing lasting 3-4 weeks. Did not seek any additional care during this time. Seems to flare in the fall.  Today's spirometry was normal. Daily controller medication(s): continue Arnuity 100mcg 1 puff once a day and rinse mouth after each use.   During respiratory infections/breathing flares:  Start Arnuity 100mcg 1 puff twice a day for 1-2 weeks until your breathing symptoms return to baseline.  Pretreat with albuterol  2 puffs. May use albuterol  rescue inhaler 2 puffs every 4 to 6 hours as needed for shortness of breath, chest tightness, coughing, and wheezing. May use albuterol  rescue inhaler 2 puffs 5 to 15 minutes prior to strenuous physical activities. Monitor frequency of use - if you need to use it more than twice per week on a consistent basis let us  know.  Get spirometry at next visit.   Seasonal and perennial allergic rhinitis Past history - 2022 skin testing positive to dust mites, grass, mold, and cockroach. Kyle Baird  too drying.  Interim history - controlled with Clarinex  and Singulair . Wondering if he can stop Singulair .  Continue environmental control measures. Continue Clarinex  daily. Singulair  (montelukast ) 10mg  daily at night. You may stop taking this and  see how you are feeling. If you notice any worsening asthma/allergy symptoms then okay to restart.  Consider allergy injections for long term control if above medications do not help the symptoms - handout given.    Gastroesophageal reflux disease Past history -  Had EGD this summer requiring dilatation. Complained of dysphagia. Reviewed GI notes - had candida esophagitis. Continue lifestyle and dietary modifications. May take over the counter famotidine (pepcid) 20mg  once a day or tums as needed.   Return in about 6 months (around 08/30/2024).  Meds ordered this encounter  Medications   albuterol  (VENTOLIN  HFA) 108 (90 Base) MCG/ACT inhaler    Sig: Inhale 2 puffs into the lungs every 4 (four) hours as needed for wheezing or shortness of breath (coughing fits).    Dispense:  18 g    Refill:  1   Fluticasone  Furoate (ARNUITY ELLIPTA ) 100 MCG/ACT AEPB    Sig: Inhale 1 puff into the lungs daily. Take 1 puff TWICE a DAY during asthma flares 1-2 weeks.    Dispense:  30 each    Refill:  11   montelukast  (SINGULAIR ) 10 MG tablet    Sig: Take 1 tablet (10 mg total) by mouth daily.    Dispense:  90 tablet    Refill:  3   Lab Orders  No laboratory test(s) ordered today    Diagnostics: Spirometry:  Tracings reviewed. His effort: Good reproducible efforts. FVC: 3.24L FEV1: 2.48L, 79% predicted FEV1/FVC ratio: 77% Interpretation: Spirometry consistent with normal pattern.  Please see scanned spirometry results for details.  Results discussed with patient/family.   Medication List:  Current Outpatient Medications  Medication Sig Dispense Refill   albuterol  (VENTOLIN  HFA) 108 (90 Base) MCG/ACT inhaler Inhale 2 puffs into the lungs every 4 (four) hours as needed for wheezing or shortness of breath (coughing fits). 18 g 1   aspirin EC 81 MG tablet Take 81 mg by mouth daily.     Cholecalciferol (VITAMIN D PO) Take 1 tablet by mouth daily.     desloratadine  (CLARINEX ) 5 MG tablet TAKE 1 TABLET (5 MG TOTAL) BY MOUTH DAILY. 90 tablet 2   ezetimibe  (ZETIA ) 10 MG tablet Take 1 tablet (10 mg total) by mouth daily. 90 tablet 3   Fluticasone  Furoate (ARNUITY ELLIPTA ) 100 MCG/ACT AEPB Inhale 1 puff into the lungs daily. Take 1 puff TWICE a DAY during asthma  flares 1-2 weeks. 30 each 11   ibuprofen (ADVIL,MOTRIN) 200 MG tablet Take 600 mg by mouth every 6 (six) hours as needed.     MOUNJARO 5 MG/0.5ML Pen 0.5 ML Subcutaneous once a week     Multiple Vitamin (MULTIVITAMIN) tablet Take 1 tablet by mouth daily.     rosuvastatin  (CRESTOR ) 10 MG tablet Take 1 tablet (10 mg total) by mouth daily. 90 tablet 3   Sodium Chloride, Hypertonic, (SOCHLOR OP) Apply to eye as needed.     tamsulosin  (FLOMAX ) 0.4 MG CAPS capsule Take 1 capsule (0.4 mg total) by mouth daily after supper. 30 capsule 0   terbinafine (LAMISIL) 250 MG tablet Take 250 mg by mouth as directed. Pt takes 1 tablet on the first week of every month for one full year.     tirzepatide (MOUNJARO) 5 MG/0.5ML Pen 0.5 ML Subcutaneous once a week; Duration: 28 days     vitamin E 400 UNIT capsule Take 400 Units by mouth daily.     montelukast  (SINGULAIR ) 10 MG tablet Take 1 tablet (10 mg total) by mouth daily. 90  tablet 3   No current facility-administered medications for this visit.   Allergies: Allergies[1] I reviewed his past medical history, social history, family history, and environmental history and no significant changes have been reported from his previous visit.  Review of Systems  Constitutional:  Negative for appetite change, chills, fever and unexpected weight change.  HENT:  Negative for congestion, rhinorrhea and voice change.   Eyes:  Negative for itching.  Respiratory:  Negative for cough, chest tightness, shortness of breath and wheezing.   Cardiovascular:  Negative for chest pain.  Gastrointestinal:  Negative for abdominal pain.  Skin:  Negative for rash.  Allergic/Immunologic: Positive for environmental allergies. Negative for food allergies.  Neurological:  Negative for headaches.    Objective: BP 122/88 (BP Location: Left Arm, Patient Position: Sitting, Cuff Size: Normal)   Pulse 91   Temp 98.2 F (36.8 C) (Temporal)   Resp 16   Ht 5' 8.5 (1.74 m)   Wt 239 lb 11.2  oz (108.7 kg)   SpO2 97%   BMI 35.92 kg/m  Body mass index is 35.92 kg/m. Physical Exam Vitals and nursing note reviewed.  Constitutional:      Appearance: Normal appearance. He is well-developed.  HENT:     Head: Normocephalic and atraumatic.     Right Ear: Tympanic membrane and external ear normal.     Left Ear: Tympanic membrane and external ear normal.     Nose: Nose normal.     Mouth/Throat:     Mouth: Mucous membranes are moist.     Pharynx: Oropharynx is clear.  Eyes:     Conjunctiva/sclera: Conjunctivae normal.  Cardiovascular:     Rate and Rhythm: Normal rate and regular rhythm.     Heart sounds: Normal heart sounds. No murmur heard. Pulmonary:     Effort: Pulmonary effort is normal.     Breath sounds: Normal breath sounds. No wheezing, rhonchi or rales.  Musculoskeletal:     Cervical back: Neck supple.  Skin:    General: Skin is warm.     Findings: No rash.  Neurological:     Mental Status: He is alert and oriented to person, place, and time.  Psychiatric:        Behavior: Behavior normal.    Previous notes and tests were reviewed. The plan was reviewed with the patient/family, and all questions/concerned were addressed.  It was my pleasure to see Kyle Baird today and participate in his care. Please feel free to contact me with any questions or concerns.  Sincerely,  Orlan Cramp, DO Allergy & Immunology  [facility]  [facility] office: [phone] [facility] office: [phone]     [1]  Allergies Allergen Reactions   Budesonide-Formoterol Fumarate     Other reaction(s): candida   Penicillins Hives    Has patient had a PCN reaction causing immediate rash, facial/tongue/throat swelling, SOB or lightheadedness with hypotension: no Has patient had a PCN reaction causing severe rash involving mucus membranes or skin necrosis: yes Has patient had a PCN reaction that required hospitalization: no Has patient had a PCN reaction  occurring within the last 10 years: no If all of the above answers are NO, then may proceed with Cephalosporin use.    Trimox [Amoxicillin] Hives

## 2024-04-03 ENCOUNTER — Encounter: Payer: Self-pay | Admitting: Pharmacist

## 2024-04-03 ENCOUNTER — Other Ambulatory Visit: Payer: Self-pay

## 2024-04-03 ENCOUNTER — Ambulatory Visit: Payer: PRIVATE HEALTH INSURANCE | Admitting: Pharmacist

## 2024-04-03 ENCOUNTER — Telehealth: Payer: Self-pay

## 2024-04-03 DIAGNOSIS — E7849 Other hyperlipidemia: Secondary | ICD-10-CM | POA: Insufficient documentation

## 2024-04-03 DIAGNOSIS — E782 Mixed hyperlipidemia: Secondary | ICD-10-CM

## 2024-04-03 NOTE — Progress Notes (Deleted)
 Patient ID: [name]                 DOB: 1955/06/06                    MRN: [id number]      HPI: Kyle Baird is a 69 y.o. male patient referred to lipid clinic by Dr.Skains. PMH is significant for adrenal adenoma, asthma, HLD, fatty liver, diabetes, CAD, nonobstructive plaque noted on a CT scan in 2022 and a coronary calcium  score of 149.   he has experienced stiffness in the past with Lipitor, which resolved after discontinuation.  Reviewed options for lowering LDL cholesterol, including ezetimibe , PCSK-9 inhibitors, bempedoic acid and inclisiran.  Discussed mechanisms of action, dosing, side effects and potential decreases in LDL cholesterol.  Also reviewed cost information and potential options for patient assistance.  Current Medications: Zetia  10 mg daily, Crestor  20 mg 4 days a week and 10 mg other 3 days a week  Intolerances:  Risk Factors: CAD, Age, T2DM  LDL goal: <70 Last Lipid lab 02/09/2024 TC 143, TG 105, HDLc 42, LDLc 82   Diet:   Exercise:   Family History:   Social History:   Labs:  Lipid Panel     Component Value Date/Time   CHOL 142 01/14/2024 0836   TRIG 159 (H) 01/14/2024 0836   HDL 37 (L) 01/14/2024 0836   CHOLHDL 3.8 01/14/2024 0836   LDLCALC 77 01/14/2024 0836   LABVLDL 28 01/14/2024 0836    Past Medical History:  Diagnosis Date   Adrenal adenoma, right    Asthma    Chronically dry eyes, bilateral    COVID    Diverticulosis    per pt   Fatty liver    per pt liver bx 11-05-2006   Hyperlipidemia    Left ureteral stone    Pre-diabetes    Renal calculus, left    Wears glasses     Medications Ordered Prior to Encounter[1]  Allergies[2]  Assessment/Plan:  1. Hyperlipidemia -  No problems updated. No problem-specific Assessment & Plan notes found for this encounter.    Thank you,  Robbi Blanch, Pharm.D [place] [facility]. [facility] [address], [place], [place] [address] Phone:  [phone]; Fax: [phone]       [1]  Current Outpatient Medications on File Prior to Visit  Medication Sig Dispense Refill   albuterol  (VENTOLIN  HFA) 108 (90 Base) MCG/ACT inhaler Inhale 2 puffs into the lungs every 4 (four) hours as needed for wheezing or shortness of breath (coughing fits). 18 g 1   aspirin EC 81 MG tablet Take 81 mg by mouth daily.     Cholecalciferol (VITAMIN D PO) Take 1 tablet by mouth daily.     desloratadine  (CLARINEX ) 5 MG tablet TAKE 1 TABLET (5 MG TOTAL) BY MOUTH DAILY. 90 tablet 2   ezetimibe  (ZETIA ) 10 MG tablet Take 1 tablet (10 mg total) by mouth daily. 90 tablet 3   Fluticasone  Furoate (ARNUITY ELLIPTA ) 100 MCG/ACT AEPB Inhale 1 puff into the lungs daily. Take 1 puff TWICE a DAY during asthma flares 1-2 weeks. 30 each 11   ibuprofen (ADVIL,MOTRIN) 200 MG tablet Take 600 mg by mouth every 6 (six) hours as needed.     montelukast  (SINGULAIR ) 10 MG tablet Take 1 tablet (10 mg total) by mouth daily. 90 tablet 3   MOUNJARO 5 MG/0.5ML Pen 0.5 ML Subcutaneous once a week  Multiple Vitamin (MULTIVITAMIN) tablet Take 1 tablet by mouth daily.     rosuvastatin  (CRESTOR ) 10 MG tablet Take 1 tablet (10 mg total) by mouth daily. 90 tablet 3   Sodium Chloride, Hypertonic, (SOCHLOR OP) Apply to eye as needed.     tamsulosin  (FLOMAX ) 0.4 MG CAPS capsule Take 1 capsule (0.4 mg total) by mouth daily after supper. 30 capsule 0   terbinafine (LAMISIL) 250 MG tablet Take 250 mg by mouth as directed. Pt takes 1 tablet on the first week of every month for one full year.     tirzepatide (MOUNJARO) 5 MG/0.5ML Pen 0.5 ML Subcutaneous once a week; Duration: 28 days     vitamin E 400 UNIT capsule Take 400 Units by mouth daily.     No current facility-administered medications on file prior to visit.  [2]  Allergies Allergen Reactions   Budesonide-Formoterol Fumarate     Other reaction(s): candida   Penicillins Hives    Has patient had a PCN reaction causing immediate  rash, facial/tongue/throat swelling, SOB or lightheadedness with hypotension: no Has patient had a PCN reaction causing severe rash involving mucus membranes or skin necrosis: yes Has patient had a PCN reaction that required hospitalization: no Has patient had a PCN reaction occurring within the last 10 years: no If all of the above answers are NO, then may proceed with Cephalosporin use.    Trimox [Amoxicillin] Hives

## 2024-04-03 NOTE — Telephone Encounter (Signed)
 Lab orders released.

## 2024-04-03 NOTE — Progress Notes (Signed)
 Patient ID: GASPARE NETZEL                 DOB: Mar 04, 1956                    MRN: [id number]      HPI:  Kyle Baird is a 69 y.o. male patient referred to lipid clinic by Dr.Skains. PMH is significant for adrenal adenoma, asthma, HLD, fatty liver, diabetes, CAD, nonobstructive plaque noted on a CT scan in 2022 and a coronary calcium  score of 149.    Due to stiffness in the past with Atorvastatin, patient is taking Crestor  20 mg 4 days a week and 10 mg other 3 days a week, rather than 20 mg daily. He is tolerating this modified dosing regimen well. He is also taking Zetia  10 mg daily.  The most recent lipid panel from 02/09/2024 showed an LDL of 82, however, this result reflects the period when the patient was taking Crestor  20 mg daily.  Given LDL is not at goal, additional options for lowering LDL cholesterol, including PCSK-9 inhibitors, bempedoic acid and inclisiran.  Discussed mechanisms of action, dosing, side effects and potential decreases in LDL cholesterol.  Also reviewed cost information and potential options for patient assistance.   Patient will like to discuss these options with his wife and follow up with his decision in one week. In the meantime patient will get Lp(a) labs done.    Current Medications: Zetia  10 mg daily, Crestor  20 mg 4 days a week and 10 mg other 3 days a week  Intolerances: Atorvastatin (stiffness), resolved after discontinuation   Risk Factors: CAD, Age, T2DM  LDL goal: <70 Last Lipid lab 02/09/2024 TC 143, TG 105, HDLc 42, LDLc 82    Diet: Eats out twice weekly  B: Eggs, sausage  L: Salads, chickfila  D: home cooked meal   Exercise: About a mile everyday before winter started  Family History:  M: High cholesterol   F: High cholesterol, Heart disease (died of MI at 56)  Brother: MI at 69  Social History:  Alcohol: Occasional (half a case of beer a year)  Smoking: No  Labs:  Lipid Panel     Component Value Date/Time   CHOL 142  01/14/2024 0836   TRIG 159 (H) 01/14/2024 0836   HDL 37 (L) 01/14/2024 0836   CHOLHDL 3.8 01/14/2024 0836   LDLCALC 77 01/14/2024 0836   LABVLDL 28 01/14/2024 0836    Past Medical History:  Diagnosis Date   Adrenal adenoma, right    Asthma    Chronically dry eyes, bilateral    COVID    Diverticulosis    per pt   Fatty liver    per pt liver bx 11-05-2006   Hyperlipidemia    Left ureteral stone    Pre-diabetes    Renal calculus, left    Wears glasses     Medications Ordered Prior to Encounter[1]  Allergies[2]  Assessment/Plan:  1. Hyperlipidemia -  Problem  Hyperlipidemia   Hyperlipidemia Assessment:  LDL goal: < 70mg /dl. Last LDLc was 82 mg/dl in November 2025, while being on Crestor  20 mg daily and Ezetimibe  10 mg daily.  Tolerates Zetia  and moderate intensity statins well without any side effects  Discussed next potential options (PCSK-9 inhibitors, bempedoic acid and inclisiran); cost, dosing efficacy, side effects  Will get Lp(a) labs to assess risk of ASCVD  Plan: Continue taking Zetia  10 mg daily and Crestor  20 mg 4 days a  week and 10 mg other 3 days a week   Patient is almost ready to initiate a PCSK9-I but will like to discuss options with his wife first and follow up in one week with decision  Lp(a) labs to be completed today   Thank you, Barbar Artist, PharmD Candidate   Robbi Blanch, Pharm.D  [facility]. [facility] [address], [place], [place] [address] Phone: [phone]; Fax: [phone]         [1]  Current Outpatient Medications on File Prior to Visit  Medication Sig Dispense Refill   albuterol  (VENTOLIN  HFA) 108 (90 Base) MCG/ACT inhaler Inhale 2 puffs into the lungs every 4 (four) hours as needed for wheezing or shortness of breath (coughing fits). 18 g 1   aspirin EC 81 MG tablet Take 81 mg by mouth daily.     Cholecalciferol (VITAMIN D PO) Take 1 tablet by mouth daily.      desloratadine  (CLARINEX ) 5 MG tablet TAKE 1 TABLET (5 MG TOTAL) BY MOUTH DAILY. 90 tablet 2   ezetimibe  (ZETIA ) 10 MG tablet Take 1 tablet (10 mg total) by mouth daily. 90 tablet 3   Fluticasone  Furoate (ARNUITY ELLIPTA ) 100 MCG/ACT AEPB Inhale 1 puff into the lungs daily. Take 1 puff TWICE a DAY during asthma flares 1-2 weeks. 30 each 11   ibuprofen (ADVIL,MOTRIN) 200 MG tablet Take 600 mg by mouth every 6 (six) hours as needed.     montelukast  (SINGULAIR ) 10 MG tablet Take 1 tablet (10 mg total) by mouth daily. 90 tablet 3   MOUNJARO 5 MG/0.5ML Pen 0.5 ML Subcutaneous once a week     Multiple Vitamin (MULTIVITAMIN) tablet Take 1 tablet by mouth daily.     rosuvastatin  (CRESTOR ) 10 MG tablet Take 1 tablet (10 mg total) by mouth daily. 90 tablet 3   Sodium Chloride, Hypertonic, (SOCHLOR OP) Apply to eye as needed.     tamsulosin  (FLOMAX ) 0.4 MG CAPS capsule Take 1 capsule (0.4 mg total) by mouth daily after supper. 30 capsule 0   terbinafine (LAMISIL) 250 MG tablet Take 250 mg by mouth as directed. Pt takes 1 tablet on the first week of every month for one full year.     tirzepatide (MOUNJARO) 5 MG/0.5ML Pen 0.5 ML Subcutaneous once a week; Duration: 28 days     vitamin E 400 UNIT capsule Take 400 Units by mouth daily.     No current facility-administered medications on file prior to visit.  [2]  Allergies Allergen Reactions   Budesonide-Formoterol Fumarate     Other reaction(s): candida   Penicillins Hives    Has patient had a PCN reaction causing immediate rash, facial/tongue/throat swelling, SOB or lightheadedness with hypotension: no Has patient had a PCN reaction causing severe rash involving mucus membranes or skin necrosis: yes Has patient had a PCN reaction that required hospitalization: no Has patient had a PCN reaction occurring within the last 10 years: no If all of the above answers are NO, then may proceed with Cephalosporin use.    Trimox [Amoxicillin] Hives

## 2024-04-03 NOTE — Patient Instructions (Signed)
 Your Results:             Your most recent labs Goal  Total Cholesterol 143 < 200  Triglycerides 105 < 150  HDL (happy/good cholesterol) 42 > 40  LDL (lousy/bad cholesterol 82 < 70   Medication changes: continue taking current medications  We will start the process to get PCSK9i (Repatha  or Praluent)  covered by your insurance.  Once the prior authorization is complete, we will call you to let you know and confirm pharmacy information.      Praluent is a cholesterol medication that improved your body's ability to get rid of bad cholesterol known as LDL. It can lower your LDL up to 60%. It is an injection that is given under the skin every 2 weeks. The most common side effects of Praluent include runny nose, symptoms of the common cold, rarely flu or flu-like symptoms, back/muscle pain in about 3-4% of the patients, and redness, pain, or bruising at the injection site.    Repatha  is a cholesterol medication that improved your body's ability to get rid of bad cholesterol known as LDL. It can lower your LDL up to 60%! It is an injection that is given under the skin every 2 weeks. The medication often requires a prior authorization from your insurance company. We will take care of submitting all the necessary information to your insurance company to get it approved. The most common side effects of Repatha  include runny nose, symptoms of the common cold, rarely flu or flu-like symptoms, back/muscle pain in about 3-4% of the patients, and redness, pain, or bruising at the injection site.   Lab orders: We want to repeat labs after 2-3 months.  We will send you a lab order to remind you once we get closer to that time.

## 2024-04-03 NOTE — Assessment & Plan Note (Addendum)
 Assessment:  LDL goal: < 70mg /dl. Last LDLc was 82 mg/dl in November 2025, while being on Crestor  20 mg daily and Ezetimibe  10 mg daily.  Tolerates Zetia  and moderate intensity statins well without any side effects  Discussed next potential options (PCSK-9 inhibitors, bempedoic acid and inclisiran); cost, dosing efficacy, side effects  Will get Lp(a) labs to assess risk of ASCVD  Plan: Continue taking Zetia  10 mg daily and Crestor  20 mg 4 days a week and 10 mg other 3 days a week   Patient is almost ready to initiate a PCSK9-I but will like to discuss options with his wife first and follow up in one week with decision  Lp(a) labs to be completed today

## 2024-04-04 LAB — LIPOPROTEIN A (LPA): Lipoprotein (a): 28.9 nmol/L

## 2024-04-05 ENCOUNTER — Other Ambulatory Visit (HOSPITAL_COMMUNITY): Payer: Self-pay

## 2024-04-05 ENCOUNTER — Telehealth: Payer: Self-pay | Admitting: Pharmacy Technician

## 2024-04-05 MED ORDER — REPATHA SURECLICK 140 MG/ML ~~LOC~~ SOAJ: 140.0000 mg | SUBCUTANEOUS | 3 refills | Status: AC

## 2024-04-05 NOTE — Telephone Encounter (Signed)
 Patel, Vaishali K, [facility] to Rx Prior Auth Team (Selected Message)     04/05/24 12:01 PM Please assess coverage for PCSK9i   Pharmacy Patient Advocate Encounter   Received notification from Pt Calls Messages that prior authorization for REPATHA  is required/requested.   Insurance verification completed.   The patient is insured through [facility].    Per cmm -pa not needed   Ran test claim for repatha . For a 28 day supply and the co-pay is 99.63 . PA is not needed at this time. This test claim was processed through [facility]- copay amounts may vary at other pharmacies due to pharmacy/plan contracts, or as the patient moves through the different stages of their insurance plan.

## 2024-04-05 NOTE — Telephone Encounter (Signed)
 Pt informed about approval - f/u lab due on July 10, 2024

## 2024-04-05 NOTE — Addendum Note (Signed)
 Addended by: Geza Beranek K on: 04/05/2024 04:16 PM   Modules accepted: Orders

## 2024-04-06 NOTE — Telephone Encounter (Signed)
 PA for Repatha  has been approved pt informed

## 2024-07-17 ENCOUNTER — Ambulatory Visit: Payer: PRIVATE HEALTH INSURANCE | Admitting: Cardiology

## 2024-08-30 ENCOUNTER — Ambulatory Visit: Admitting: Allergy
